# Patient Record
Sex: Female | Born: 1935
Health system: Southern US, Community
[De-identification: ages and names within clinical notes are randomized; demographics above are authoritative.]

## PROBLEM LIST (undated history)

## (undated) DIAGNOSIS — K219 Gastro-esophageal reflux disease without esophagitis: Secondary | ICD-10-CM

## (undated) DIAGNOSIS — M1990 Unspecified osteoarthritis, unspecified site: Secondary | ICD-10-CM

## (undated) DIAGNOSIS — R32 Unspecified urinary incontinence: Secondary | ICD-10-CM

## (undated) DIAGNOSIS — F32A Depression, unspecified: Secondary | ICD-10-CM

## (undated) DIAGNOSIS — F419 Anxiety disorder, unspecified: Secondary | ICD-10-CM

## (undated) HISTORY — DX: Anxiety disorder, unspecified: F41.9

## (undated) HISTORY — PX: ABDOMINAL HYSTERECTOMY: SHX81

## (undated) HISTORY — DX: Unspecified urinary incontinence: R32

## (undated) HISTORY — PX: EYE SURGERY: SHX253

## (undated) HISTORY — PX: JOINT REPLACEMENT: SHX530

## (undated) HISTORY — PX: BREAST EXCISIONAL BIOPSY: SUR124

## (undated) HISTORY — DX: Depression, unspecified: F32.A

---

## 1998-06-12 ENCOUNTER — Encounter: Payer: Self-pay | Admitting: Emergency Medicine

## 1998-06-12 ENCOUNTER — Emergency Department (HOSPITAL_COMMUNITY): Admission: EM | Admit: 1998-06-12 | Discharge: 1998-06-12 | Payer: Self-pay | Admitting: Emergency Medicine

## 2004-01-15 ENCOUNTER — Encounter: Admission: RE | Admit: 2004-01-15 | Discharge: 2004-01-15 | Payer: Self-pay | Admitting: Family Medicine

## 2004-07-16 ENCOUNTER — Encounter: Admission: RE | Admit: 2004-07-16 | Discharge: 2004-07-16 | Payer: Self-pay | Admitting: Internal Medicine

## 2005-07-29 ENCOUNTER — Encounter: Admission: RE | Admit: 2005-07-29 | Discharge: 2005-07-29 | Payer: Self-pay | Admitting: Internal Medicine

## 2006-08-03 ENCOUNTER — Encounter: Admission: RE | Admit: 2006-08-03 | Discharge: 2006-08-03 | Payer: Self-pay | Admitting: Internal Medicine

## 2007-03-23 ENCOUNTER — Encounter: Admission: RE | Admit: 2007-03-23 | Discharge: 2007-03-23 | Payer: Self-pay | Admitting: Internal Medicine

## 2007-09-30 ENCOUNTER — Encounter: Admission: RE | Admit: 2007-09-30 | Discharge: 2007-09-30 | Payer: Self-pay | Admitting: Internal Medicine

## 2008-10-09 ENCOUNTER — Encounter: Admission: RE | Admit: 2008-10-09 | Discharge: 2008-10-09 | Payer: Self-pay | Admitting: Obstetrics and Gynecology

## 2009-09-23 ENCOUNTER — Encounter: Admission: RE | Admit: 2009-09-23 | Discharge: 2009-10-21 | Payer: Self-pay | Admitting: Internal Medicine

## 2009-10-10 ENCOUNTER — Encounter: Admission: RE | Admit: 2009-10-10 | Discharge: 2009-10-10 | Payer: Self-pay | Admitting: Internal Medicine

## 2010-07-13 ENCOUNTER — Encounter: Payer: Self-pay | Admitting: Family Medicine

## 2010-09-29 ENCOUNTER — Other Ambulatory Visit: Payer: Self-pay | Admitting: Internal Medicine

## 2010-09-29 DIAGNOSIS — Z1231 Encounter for screening mammogram for malignant neoplasm of breast: Secondary | ICD-10-CM

## 2010-11-04 ENCOUNTER — Ambulatory Visit
Admission: RE | Admit: 2010-11-04 | Discharge: 2010-11-04 | Disposition: A | Discharge status: Home / Self Care | Payer: Medicare Other | Source: Ambulatory Visit | Attending: Internal Medicine | Admitting: Internal Medicine

## 2010-11-04 DIAGNOSIS — Z1231 Encounter for screening mammogram for malignant neoplasm of breast: Secondary | ICD-10-CM

## 2011-06-30 DIAGNOSIS — R229 Localized swelling, mass and lump, unspecified: Secondary | ICD-10-CM | POA: Diagnosis not present

## 2011-06-30 DIAGNOSIS — L989 Disorder of the skin and subcutaneous tissue, unspecified: Secondary | ICD-10-CM | POA: Diagnosis not present

## 2011-06-30 DIAGNOSIS — L820 Inflamed seborrheic keratosis: Secondary | ICD-10-CM | POA: Diagnosis not present

## 2011-07-06 DIAGNOSIS — E7800 Pure hypercholesterolemia, unspecified: Secondary | ICD-10-CM | POA: Diagnosis not present

## 2011-07-06 DIAGNOSIS — R5383 Other fatigue: Secondary | ICD-10-CM | POA: Diagnosis not present

## 2011-07-09 DIAGNOSIS — E7800 Pure hypercholesterolemia, unspecified: Secondary | ICD-10-CM | POA: Diagnosis not present

## 2011-07-15 DIAGNOSIS — IMO0002 Reserved for concepts with insufficient information to code with codable children: Secondary | ICD-10-CM | POA: Diagnosis not present

## 2011-07-15 DIAGNOSIS — M999 Biomechanical lesion, unspecified: Secondary | ICD-10-CM | POA: Diagnosis not present

## 2011-07-15 DIAGNOSIS — M546 Pain in thoracic spine: Secondary | ICD-10-CM | POA: Diagnosis not present

## 2011-08-26 DIAGNOSIS — IMO0002 Reserved for concepts with insufficient information to code with codable children: Secondary | ICD-10-CM | POA: Diagnosis not present

## 2011-08-26 DIAGNOSIS — M546 Pain in thoracic spine: Secondary | ICD-10-CM | POA: Diagnosis not present

## 2011-08-26 DIAGNOSIS — M999 Biomechanical lesion, unspecified: Secondary | ICD-10-CM | POA: Diagnosis not present

## 2011-09-29 ENCOUNTER — Other Ambulatory Visit: Payer: Self-pay | Admitting: Internal Medicine

## 2011-09-29 DIAGNOSIS — Z1231 Encounter for screening mammogram for malignant neoplasm of breast: Secondary | ICD-10-CM

## 2011-10-08 DIAGNOSIS — M546 Pain in thoracic spine: Secondary | ICD-10-CM | POA: Diagnosis not present

## 2011-10-08 DIAGNOSIS — M999 Biomechanical lesion, unspecified: Secondary | ICD-10-CM | POA: Diagnosis not present

## 2011-10-08 DIAGNOSIS — IMO0002 Reserved for concepts with insufficient information to code with codable children: Secondary | ICD-10-CM | POA: Diagnosis not present

## 2011-11-05 ENCOUNTER — Ambulatory Visit
Admission: RE | Admit: 2011-11-05 | Discharge: 2011-11-05 | Disposition: A | Discharge status: Home / Self Care | Payer: No Typology Code available for payment source | Source: Ambulatory Visit | Attending: Internal Medicine | Admitting: Internal Medicine

## 2011-11-05 DIAGNOSIS — Z1231 Encounter for screening mammogram for malignant neoplasm of breast: Secondary | ICD-10-CM

## 2011-11-12 DIAGNOSIS — H43819 Vitreous degeneration, unspecified eye: Secondary | ICD-10-CM | POA: Diagnosis not present

## 2011-11-17 ENCOUNTER — Encounter (INDEPENDENT_AMBULATORY_CARE_PROVIDER_SITE_OTHER): Payer: Medicare Other | Admitting: Ophthalmology

## 2011-11-17 DIAGNOSIS — H43819 Vitreous degeneration, unspecified eye: Secondary | ICD-10-CM

## 2011-11-17 DIAGNOSIS — H33309 Unspecified retinal break, unspecified eye: Secondary | ICD-10-CM

## 2011-12-01 DIAGNOSIS — Z01419 Encounter for gynecological examination (general) (routine) without abnormal findings: Secondary | ICD-10-CM | POA: Diagnosis not present

## 2011-12-02 ENCOUNTER — Ambulatory Visit (INDEPENDENT_AMBULATORY_CARE_PROVIDER_SITE_OTHER): Payer: Medicare Other | Admitting: Ophthalmology

## 2011-12-02 DIAGNOSIS — H33309 Unspecified retinal break, unspecified eye: Secondary | ICD-10-CM

## 2011-12-09 DIAGNOSIS — IMO0002 Reserved for concepts with insufficient information to code with codable children: Secondary | ICD-10-CM | POA: Diagnosis not present

## 2011-12-09 DIAGNOSIS — M999 Biomechanical lesion, unspecified: Secondary | ICD-10-CM | POA: Diagnosis not present

## 2011-12-09 DIAGNOSIS — M546 Pain in thoracic spine: Secondary | ICD-10-CM | POA: Diagnosis not present

## 2012-01-05 DIAGNOSIS — E559 Vitamin D deficiency, unspecified: Secondary | ICD-10-CM | POA: Diagnosis not present

## 2012-01-05 DIAGNOSIS — R5383 Other fatigue: Secondary | ICD-10-CM | POA: Diagnosis not present

## 2012-01-05 DIAGNOSIS — E7800 Pure hypercholesterolemia, unspecified: Secondary | ICD-10-CM | POA: Diagnosis not present

## 2012-01-05 DIAGNOSIS — R5381 Other malaise: Secondary | ICD-10-CM | POA: Diagnosis not present

## 2012-01-07 DIAGNOSIS — H43819 Vitreous degeneration, unspecified eye: Secondary | ICD-10-CM | POA: Diagnosis not present

## 2012-01-11 DIAGNOSIS — G4700 Insomnia, unspecified: Secondary | ICD-10-CM | POA: Diagnosis not present

## 2012-01-11 DIAGNOSIS — E7800 Pure hypercholesterolemia, unspecified: Secondary | ICD-10-CM | POA: Diagnosis not present

## 2012-01-11 DIAGNOSIS — E559 Vitamin D deficiency, unspecified: Secondary | ICD-10-CM | POA: Diagnosis not present

## 2012-02-09 DIAGNOSIS — IMO0002 Reserved for concepts with insufficient information to code with codable children: Secondary | ICD-10-CM | POA: Diagnosis not present

## 2012-02-09 DIAGNOSIS — M546 Pain in thoracic spine: Secondary | ICD-10-CM | POA: Diagnosis not present

## 2012-02-09 DIAGNOSIS — M999 Biomechanical lesion, unspecified: Secondary | ICD-10-CM | POA: Diagnosis not present

## 2012-02-10 DIAGNOSIS — Z13820 Encounter for screening for osteoporosis: Secondary | ICD-10-CM | POA: Diagnosis not present

## 2012-02-10 DIAGNOSIS — M810 Age-related osteoporosis without current pathological fracture: Secondary | ICD-10-CM | POA: Diagnosis not present

## 2012-04-04 ENCOUNTER — Ambulatory Visit (INDEPENDENT_AMBULATORY_CARE_PROVIDER_SITE_OTHER): Payer: Medicare Other | Admitting: Ophthalmology

## 2012-04-04 DIAGNOSIS — H43819 Vitreous degeneration, unspecified eye: Secondary | ICD-10-CM

## 2012-04-04 DIAGNOSIS — H33309 Unspecified retinal break, unspecified eye: Secondary | ICD-10-CM

## 2012-04-11 DIAGNOSIS — M999 Biomechanical lesion, unspecified: Secondary | ICD-10-CM | POA: Diagnosis not present

## 2012-04-11 DIAGNOSIS — M546 Pain in thoracic spine: Secondary | ICD-10-CM | POA: Diagnosis not present

## 2012-04-11 DIAGNOSIS — IMO0002 Reserved for concepts with insufficient information to code with codable children: Secondary | ICD-10-CM | POA: Diagnosis not present

## 2012-04-15 DIAGNOSIS — Z23 Encounter for immunization: Secondary | ICD-10-CM | POA: Diagnosis not present

## 2012-05-23 DIAGNOSIS — D131 Benign neoplasm of stomach: Secondary | ICD-10-CM | POA: Diagnosis not present

## 2012-05-23 DIAGNOSIS — K2270 Barrett's esophagus without dysplasia: Secondary | ICD-10-CM | POA: Diagnosis not present

## 2012-05-23 DIAGNOSIS — K449 Diaphragmatic hernia without obstruction or gangrene: Secondary | ICD-10-CM | POA: Diagnosis not present

## 2012-05-23 DIAGNOSIS — K319 Disease of stomach and duodenum, unspecified: Secondary | ICD-10-CM | POA: Diagnosis not present

## 2012-06-13 DIAGNOSIS — IMO0002 Reserved for concepts with insufficient information to code with codable children: Secondary | ICD-10-CM | POA: Diagnosis not present

## 2012-06-13 DIAGNOSIS — M546 Pain in thoracic spine: Secondary | ICD-10-CM | POA: Diagnosis not present

## 2012-06-13 DIAGNOSIS — M999 Biomechanical lesion, unspecified: Secondary | ICD-10-CM | POA: Diagnosis not present

## 2012-07-07 DIAGNOSIS — E7800 Pure hypercholesterolemia, unspecified: Secondary | ICD-10-CM | POA: Diagnosis not present

## 2012-07-13 DIAGNOSIS — G4700 Insomnia, unspecified: Secondary | ICD-10-CM | POA: Diagnosis not present

## 2012-07-13 DIAGNOSIS — E785 Hyperlipidemia, unspecified: Secondary | ICD-10-CM | POA: Diagnosis not present

## 2012-07-13 DIAGNOSIS — M76899 Other specified enthesopathies of unspecified lower limb, excluding foot: Secondary | ICD-10-CM | POA: Diagnosis not present

## 2012-08-15 DIAGNOSIS — M546 Pain in thoracic spine: Secondary | ICD-10-CM | POA: Diagnosis not present

## 2012-08-15 DIAGNOSIS — M999 Biomechanical lesion, unspecified: Secondary | ICD-10-CM | POA: Diagnosis not present

## 2012-08-15 DIAGNOSIS — IMO0002 Reserved for concepts with insufficient information to code with codable children: Secondary | ICD-10-CM | POA: Diagnosis not present

## 2012-08-29 DIAGNOSIS — M545 Low back pain: Secondary | ICD-10-CM | POA: Diagnosis not present

## 2012-08-29 DIAGNOSIS — IMO0002 Reserved for concepts with insufficient information to code with codable children: Secondary | ICD-10-CM | POA: Diagnosis not present

## 2012-08-29 DIAGNOSIS — M5137 Other intervertebral disc degeneration, lumbosacral region: Secondary | ICD-10-CM | POA: Diagnosis not present

## 2012-09-13 DIAGNOSIS — M545 Low back pain: Secondary | ICD-10-CM | POA: Diagnosis not present

## 2012-09-27 DIAGNOSIS — M5137 Other intervertebral disc degeneration, lumbosacral region: Secondary | ICD-10-CM | POA: Diagnosis not present

## 2012-09-27 DIAGNOSIS — IMO0002 Reserved for concepts with insufficient information to code with codable children: Secondary | ICD-10-CM | POA: Diagnosis not present

## 2012-09-27 DIAGNOSIS — M48061 Spinal stenosis, lumbar region without neurogenic claudication: Secondary | ICD-10-CM | POA: Diagnosis not present

## 2012-10-05 ENCOUNTER — Other Ambulatory Visit: Payer: Self-pay

## 2012-10-05 DIAGNOSIS — Z1231 Encounter for screening mammogram for malignant neoplasm of breast: Secondary | ICD-10-CM

## 2012-10-10 DIAGNOSIS — M546 Pain in thoracic spine: Secondary | ICD-10-CM | POA: Diagnosis not present

## 2012-10-10 DIAGNOSIS — IMO0002 Reserved for concepts with insufficient information to code with codable children: Secondary | ICD-10-CM | POA: Diagnosis not present

## 2012-10-10 DIAGNOSIS — M999 Biomechanical lesion, unspecified: Secondary | ICD-10-CM | POA: Diagnosis not present

## 2012-10-12 DIAGNOSIS — M48061 Spinal stenosis, lumbar region without neurogenic claudication: Secondary | ICD-10-CM | POA: Diagnosis not present

## 2012-10-12 DIAGNOSIS — M5137 Other intervertebral disc degeneration, lumbosacral region: Secondary | ICD-10-CM | POA: Diagnosis not present

## 2012-10-24 DIAGNOSIS — M48061 Spinal stenosis, lumbar region without neurogenic claudication: Secondary | ICD-10-CM | POA: Diagnosis not present

## 2012-10-24 DIAGNOSIS — M5137 Other intervertebral disc degeneration, lumbosacral region: Secondary | ICD-10-CM | POA: Diagnosis not present

## 2012-10-24 DIAGNOSIS — M545 Low back pain: Secondary | ICD-10-CM | POA: Diagnosis not present

## 2012-11-03 DIAGNOSIS — Z0000 Encounter for general adult medical examination without abnormal findings: Secondary | ICD-10-CM | POA: Diagnosis not present

## 2012-11-03 DIAGNOSIS — R42 Dizziness and giddiness: Secondary | ICD-10-CM | POA: Diagnosis not present

## 2012-11-07 ENCOUNTER — Ambulatory Visit
Admission: RE | Admit: 2012-11-07 | Discharge: 2012-11-07 | Disposition: A | Discharge status: Home / Self Care | Payer: Medicare Other | Source: Ambulatory Visit

## 2012-11-07 DIAGNOSIS — Z1231 Encounter for screening mammogram for malignant neoplasm of breast: Secondary | ICD-10-CM | POA: Diagnosis not present

## 2012-12-12 DIAGNOSIS — M546 Pain in thoracic spine: Secondary | ICD-10-CM | POA: Diagnosis not present

## 2012-12-12 DIAGNOSIS — IMO0002 Reserved for concepts with insufficient information to code with codable children: Secondary | ICD-10-CM | POA: Diagnosis not present

## 2012-12-12 DIAGNOSIS — Z124 Encounter for screening for malignant neoplasm of cervix: Secondary | ICD-10-CM | POA: Diagnosis not present

## 2012-12-12 DIAGNOSIS — M999 Biomechanical lesion, unspecified: Secondary | ICD-10-CM | POA: Diagnosis not present

## 2013-01-05 DIAGNOSIS — M76899 Other specified enthesopathies of unspecified lower limb, excluding foot: Secondary | ICD-10-CM | POA: Diagnosis not present

## 2013-01-05 DIAGNOSIS — E785 Hyperlipidemia, unspecified: Secondary | ICD-10-CM | POA: Diagnosis not present

## 2013-01-05 DIAGNOSIS — E559 Vitamin D deficiency, unspecified: Secondary | ICD-10-CM | POA: Diagnosis not present

## 2013-01-11 DIAGNOSIS — G4700 Insomnia, unspecified: Secondary | ICD-10-CM | POA: Diagnosis not present

## 2013-01-11 DIAGNOSIS — H43819 Vitreous degeneration, unspecified eye: Secondary | ICD-10-CM | POA: Diagnosis not present

## 2013-01-11 DIAGNOSIS — E785 Hyperlipidemia, unspecified: Secondary | ICD-10-CM | POA: Diagnosis not present

## 2013-01-11 DIAGNOSIS — E559 Vitamin D deficiency, unspecified: Secondary | ICD-10-CM | POA: Diagnosis not present

## 2013-01-11 DIAGNOSIS — M629 Disorder of muscle, unspecified: Secondary | ICD-10-CM | POA: Diagnosis not present

## 2013-02-13 DIAGNOSIS — M546 Pain in thoracic spine: Secondary | ICD-10-CM | POA: Diagnosis not present

## 2013-02-13 DIAGNOSIS — M999 Biomechanical lesion, unspecified: Secondary | ICD-10-CM | POA: Diagnosis not present

## 2013-02-13 DIAGNOSIS — IMO0002 Reserved for concepts with insufficient information to code with codable children: Secondary | ICD-10-CM | POA: Diagnosis not present

## 2013-03-06 DIAGNOSIS — M5137 Other intervertebral disc degeneration, lumbosacral region: Secondary | ICD-10-CM | POA: Diagnosis not present

## 2013-03-06 DIAGNOSIS — M25559 Pain in unspecified hip: Secondary | ICD-10-CM | POA: Diagnosis not present

## 2013-03-06 DIAGNOSIS — M999 Biomechanical lesion, unspecified: Secondary | ICD-10-CM | POA: Diagnosis not present

## 2013-03-06 DIAGNOSIS — M5126 Other intervertebral disc displacement, lumbar region: Secondary | ICD-10-CM | POA: Diagnosis not present

## 2013-03-06 DIAGNOSIS — IMO0002 Reserved for concepts with insufficient information to code with codable children: Secondary | ICD-10-CM | POA: Diagnosis not present

## 2013-03-07 DIAGNOSIS — M5137 Other intervertebral disc degeneration, lumbosacral region: Secondary | ICD-10-CM | POA: Diagnosis not present

## 2013-03-07 DIAGNOSIS — M999 Biomechanical lesion, unspecified: Secondary | ICD-10-CM | POA: Diagnosis not present

## 2013-03-07 DIAGNOSIS — M5126 Other intervertebral disc displacement, lumbar region: Secondary | ICD-10-CM | POA: Diagnosis not present

## 2013-03-07 DIAGNOSIS — IMO0002 Reserved for concepts with insufficient information to code with codable children: Secondary | ICD-10-CM | POA: Diagnosis not present

## 2013-03-07 DIAGNOSIS — M25559 Pain in unspecified hip: Secondary | ICD-10-CM | POA: Diagnosis not present

## 2013-03-08 DIAGNOSIS — M5137 Other intervertebral disc degeneration, lumbosacral region: Secondary | ICD-10-CM | POA: Diagnosis not present

## 2013-03-08 DIAGNOSIS — M25559 Pain in unspecified hip: Secondary | ICD-10-CM | POA: Diagnosis not present

## 2013-03-08 DIAGNOSIS — M999 Biomechanical lesion, unspecified: Secondary | ICD-10-CM | POA: Diagnosis not present

## 2013-03-08 DIAGNOSIS — M5126 Other intervertebral disc displacement, lumbar region: Secondary | ICD-10-CM | POA: Diagnosis not present

## 2013-03-08 DIAGNOSIS — IMO0002 Reserved for concepts with insufficient information to code with codable children: Secondary | ICD-10-CM | POA: Diagnosis not present

## 2013-03-10 DIAGNOSIS — IMO0002 Reserved for concepts with insufficient information to code with codable children: Secondary | ICD-10-CM | POA: Diagnosis not present

## 2013-03-10 DIAGNOSIS — M5137 Other intervertebral disc degeneration, lumbosacral region: Secondary | ICD-10-CM | POA: Diagnosis not present

## 2013-03-10 DIAGNOSIS — M5126 Other intervertebral disc displacement, lumbar region: Secondary | ICD-10-CM | POA: Diagnosis not present

## 2013-03-10 DIAGNOSIS — M999 Biomechanical lesion, unspecified: Secondary | ICD-10-CM | POA: Diagnosis not present

## 2013-03-10 DIAGNOSIS — M25559 Pain in unspecified hip: Secondary | ICD-10-CM | POA: Diagnosis not present

## 2013-03-10 DIAGNOSIS — Z23 Encounter for immunization: Secondary | ICD-10-CM | POA: Diagnosis not present

## 2013-03-13 DIAGNOSIS — M5126 Other intervertebral disc displacement, lumbar region: Secondary | ICD-10-CM | POA: Diagnosis not present

## 2013-03-13 DIAGNOSIS — M999 Biomechanical lesion, unspecified: Secondary | ICD-10-CM | POA: Diagnosis not present

## 2013-03-13 DIAGNOSIS — M5137 Other intervertebral disc degeneration, lumbosacral region: Secondary | ICD-10-CM | POA: Diagnosis not present

## 2013-03-13 DIAGNOSIS — M25559 Pain in unspecified hip: Secondary | ICD-10-CM | POA: Diagnosis not present

## 2013-03-13 DIAGNOSIS — IMO0002 Reserved for concepts with insufficient information to code with codable children: Secondary | ICD-10-CM | POA: Diagnosis not present

## 2013-03-14 DIAGNOSIS — M5137 Other intervertebral disc degeneration, lumbosacral region: Secondary | ICD-10-CM | POA: Diagnosis not present

## 2013-03-14 DIAGNOSIS — M25559 Pain in unspecified hip: Secondary | ICD-10-CM | POA: Diagnosis not present

## 2013-03-14 DIAGNOSIS — M999 Biomechanical lesion, unspecified: Secondary | ICD-10-CM | POA: Diagnosis not present

## 2013-03-14 DIAGNOSIS — IMO0002 Reserved for concepts with insufficient information to code with codable children: Secondary | ICD-10-CM | POA: Diagnosis not present

## 2013-03-14 DIAGNOSIS — M5126 Other intervertebral disc displacement, lumbar region: Secondary | ICD-10-CM | POA: Diagnosis not present

## 2013-03-16 DIAGNOSIS — M25559 Pain in unspecified hip: Secondary | ICD-10-CM | POA: Diagnosis not present

## 2013-03-16 DIAGNOSIS — M5137 Other intervertebral disc degeneration, lumbosacral region: Secondary | ICD-10-CM | POA: Diagnosis not present

## 2013-03-16 DIAGNOSIS — M5126 Other intervertebral disc displacement, lumbar region: Secondary | ICD-10-CM | POA: Diagnosis not present

## 2013-03-16 DIAGNOSIS — M999 Biomechanical lesion, unspecified: Secondary | ICD-10-CM | POA: Diagnosis not present

## 2013-03-16 DIAGNOSIS — IMO0002 Reserved for concepts with insufficient information to code with codable children: Secondary | ICD-10-CM | POA: Diagnosis not present

## 2013-03-20 DIAGNOSIS — M5126 Other intervertebral disc displacement, lumbar region: Secondary | ICD-10-CM | POA: Diagnosis not present

## 2013-03-20 DIAGNOSIS — M999 Biomechanical lesion, unspecified: Secondary | ICD-10-CM | POA: Diagnosis not present

## 2013-03-20 DIAGNOSIS — IMO0002 Reserved for concepts with insufficient information to code with codable children: Secondary | ICD-10-CM | POA: Diagnosis not present

## 2013-03-20 DIAGNOSIS — M25559 Pain in unspecified hip: Secondary | ICD-10-CM | POA: Diagnosis not present

## 2013-03-20 DIAGNOSIS — M5137 Other intervertebral disc degeneration, lumbosacral region: Secondary | ICD-10-CM | POA: Diagnosis not present

## 2013-03-21 DIAGNOSIS — M5137 Other intervertebral disc degeneration, lumbosacral region: Secondary | ICD-10-CM | POA: Diagnosis not present

## 2013-03-21 DIAGNOSIS — M25559 Pain in unspecified hip: Secondary | ICD-10-CM | POA: Diagnosis not present

## 2013-03-21 DIAGNOSIS — M5126 Other intervertebral disc displacement, lumbar region: Secondary | ICD-10-CM | POA: Diagnosis not present

## 2013-03-21 DIAGNOSIS — IMO0002 Reserved for concepts with insufficient information to code with codable children: Secondary | ICD-10-CM | POA: Diagnosis not present

## 2013-03-21 DIAGNOSIS — M999 Biomechanical lesion, unspecified: Secondary | ICD-10-CM | POA: Diagnosis not present

## 2013-03-23 DIAGNOSIS — IMO0002 Reserved for concepts with insufficient information to code with codable children: Secondary | ICD-10-CM | POA: Diagnosis not present

## 2013-03-23 DIAGNOSIS — M25559 Pain in unspecified hip: Secondary | ICD-10-CM | POA: Diagnosis not present

## 2013-03-23 DIAGNOSIS — M5126 Other intervertebral disc displacement, lumbar region: Secondary | ICD-10-CM | POA: Diagnosis not present

## 2013-03-23 DIAGNOSIS — M999 Biomechanical lesion, unspecified: Secondary | ICD-10-CM | POA: Diagnosis not present

## 2013-03-23 DIAGNOSIS — M5137 Other intervertebral disc degeneration, lumbosacral region: Secondary | ICD-10-CM | POA: Diagnosis not present

## 2013-03-27 DIAGNOSIS — M999 Biomechanical lesion, unspecified: Secondary | ICD-10-CM | POA: Diagnosis not present

## 2013-03-27 DIAGNOSIS — M5126 Other intervertebral disc displacement, lumbar region: Secondary | ICD-10-CM | POA: Diagnosis not present

## 2013-03-27 DIAGNOSIS — M5137 Other intervertebral disc degeneration, lumbosacral region: Secondary | ICD-10-CM | POA: Diagnosis not present

## 2013-03-27 DIAGNOSIS — IMO0002 Reserved for concepts with insufficient information to code with codable children: Secondary | ICD-10-CM | POA: Diagnosis not present

## 2013-03-27 DIAGNOSIS — M25559 Pain in unspecified hip: Secondary | ICD-10-CM | POA: Diagnosis not present

## 2013-03-28 DIAGNOSIS — M999 Biomechanical lesion, unspecified: Secondary | ICD-10-CM | POA: Diagnosis not present

## 2013-03-28 DIAGNOSIS — M5137 Other intervertebral disc degeneration, lumbosacral region: Secondary | ICD-10-CM | POA: Diagnosis not present

## 2013-03-28 DIAGNOSIS — M25559 Pain in unspecified hip: Secondary | ICD-10-CM | POA: Diagnosis not present

## 2013-03-28 DIAGNOSIS — M5126 Other intervertebral disc displacement, lumbar region: Secondary | ICD-10-CM | POA: Diagnosis not present

## 2013-03-28 DIAGNOSIS — IMO0002 Reserved for concepts with insufficient information to code with codable children: Secondary | ICD-10-CM | POA: Diagnosis not present

## 2013-03-30 DIAGNOSIS — IMO0002 Reserved for concepts with insufficient information to code with codable children: Secondary | ICD-10-CM | POA: Diagnosis not present

## 2013-03-30 DIAGNOSIS — M999 Biomechanical lesion, unspecified: Secondary | ICD-10-CM | POA: Diagnosis not present

## 2013-03-30 DIAGNOSIS — M5126 Other intervertebral disc displacement, lumbar region: Secondary | ICD-10-CM | POA: Diagnosis not present

## 2013-03-30 DIAGNOSIS — M5137 Other intervertebral disc degeneration, lumbosacral region: Secondary | ICD-10-CM | POA: Diagnosis not present

## 2013-03-30 DIAGNOSIS — M25559 Pain in unspecified hip: Secondary | ICD-10-CM | POA: Diagnosis not present

## 2013-04-03 DIAGNOSIS — IMO0002 Reserved for concepts with insufficient information to code with codable children: Secondary | ICD-10-CM | POA: Diagnosis not present

## 2013-04-03 DIAGNOSIS — M5126 Other intervertebral disc displacement, lumbar region: Secondary | ICD-10-CM | POA: Diagnosis not present

## 2013-04-03 DIAGNOSIS — M999 Biomechanical lesion, unspecified: Secondary | ICD-10-CM | POA: Diagnosis not present

## 2013-04-03 DIAGNOSIS — M5137 Other intervertebral disc degeneration, lumbosacral region: Secondary | ICD-10-CM | POA: Diagnosis not present

## 2013-04-03 DIAGNOSIS — M25559 Pain in unspecified hip: Secondary | ICD-10-CM | POA: Diagnosis not present

## 2013-04-05 ENCOUNTER — Ambulatory Visit (INDEPENDENT_AMBULATORY_CARE_PROVIDER_SITE_OTHER): Payer: Medicare Other | Admitting: Ophthalmology

## 2013-04-05 DIAGNOSIS — M25559 Pain in unspecified hip: Secondary | ICD-10-CM | POA: Diagnosis not present

## 2013-04-05 DIAGNOSIS — IMO0002 Reserved for concepts with insufficient information to code with codable children: Secondary | ICD-10-CM | POA: Diagnosis not present

## 2013-04-05 DIAGNOSIS — H33309 Unspecified retinal break, unspecified eye: Secondary | ICD-10-CM | POA: Diagnosis not present

## 2013-04-05 DIAGNOSIS — H43819 Vitreous degeneration, unspecified eye: Secondary | ICD-10-CM | POA: Diagnosis not present

## 2013-04-05 DIAGNOSIS — M5137 Other intervertebral disc degeneration, lumbosacral region: Secondary | ICD-10-CM | POA: Diagnosis not present

## 2013-04-05 DIAGNOSIS — M999 Biomechanical lesion, unspecified: Secondary | ICD-10-CM | POA: Diagnosis not present

## 2013-04-05 DIAGNOSIS — M5126 Other intervertebral disc displacement, lumbar region: Secondary | ICD-10-CM | POA: Diagnosis not present

## 2013-04-10 DIAGNOSIS — M5126 Other intervertebral disc displacement, lumbar region: Secondary | ICD-10-CM | POA: Diagnosis not present

## 2013-04-10 DIAGNOSIS — M5137 Other intervertebral disc degeneration, lumbosacral region: Secondary | ICD-10-CM | POA: Diagnosis not present

## 2013-04-10 DIAGNOSIS — IMO0002 Reserved for concepts with insufficient information to code with codable children: Secondary | ICD-10-CM | POA: Diagnosis not present

## 2013-04-10 DIAGNOSIS — M25559 Pain in unspecified hip: Secondary | ICD-10-CM | POA: Diagnosis not present

## 2013-04-10 DIAGNOSIS — M999 Biomechanical lesion, unspecified: Secondary | ICD-10-CM | POA: Diagnosis not present

## 2013-04-12 DIAGNOSIS — M25559 Pain in unspecified hip: Secondary | ICD-10-CM | POA: Diagnosis not present

## 2013-04-12 DIAGNOSIS — M5137 Other intervertebral disc degeneration, lumbosacral region: Secondary | ICD-10-CM | POA: Diagnosis not present

## 2013-04-12 DIAGNOSIS — IMO0002 Reserved for concepts with insufficient information to code with codable children: Secondary | ICD-10-CM | POA: Diagnosis not present

## 2013-04-12 DIAGNOSIS — M999 Biomechanical lesion, unspecified: Secondary | ICD-10-CM | POA: Diagnosis not present

## 2013-04-12 DIAGNOSIS — M5126 Other intervertebral disc displacement, lumbar region: Secondary | ICD-10-CM | POA: Diagnosis not present

## 2013-04-20 DIAGNOSIS — M5137 Other intervertebral disc degeneration, lumbosacral region: Secondary | ICD-10-CM | POA: Diagnosis not present

## 2013-04-20 DIAGNOSIS — M999 Biomechanical lesion, unspecified: Secondary | ICD-10-CM | POA: Diagnosis not present

## 2013-04-20 DIAGNOSIS — M5126 Other intervertebral disc displacement, lumbar region: Secondary | ICD-10-CM | POA: Diagnosis not present

## 2013-04-20 DIAGNOSIS — IMO0002 Reserved for concepts with insufficient information to code with codable children: Secondary | ICD-10-CM | POA: Diagnosis not present

## 2013-04-20 DIAGNOSIS — M25559 Pain in unspecified hip: Secondary | ICD-10-CM | POA: Diagnosis not present

## 2013-04-26 DIAGNOSIS — M999 Biomechanical lesion, unspecified: Secondary | ICD-10-CM | POA: Diagnosis not present

## 2013-04-26 DIAGNOSIS — M5126 Other intervertebral disc displacement, lumbar region: Secondary | ICD-10-CM | POA: Diagnosis not present

## 2013-04-26 DIAGNOSIS — M5137 Other intervertebral disc degeneration, lumbosacral region: Secondary | ICD-10-CM | POA: Diagnosis not present

## 2013-04-26 DIAGNOSIS — M25559 Pain in unspecified hip: Secondary | ICD-10-CM | POA: Diagnosis not present

## 2013-04-26 DIAGNOSIS — IMO0002 Reserved for concepts with insufficient information to code with codable children: Secondary | ICD-10-CM | POA: Diagnosis not present

## 2013-05-01 DIAGNOSIS — M25559 Pain in unspecified hip: Secondary | ICD-10-CM | POA: Diagnosis not present

## 2013-05-01 DIAGNOSIS — M5137 Other intervertebral disc degeneration, lumbosacral region: Secondary | ICD-10-CM | POA: Diagnosis not present

## 2013-05-01 DIAGNOSIS — M999 Biomechanical lesion, unspecified: Secondary | ICD-10-CM | POA: Diagnosis not present

## 2013-05-01 DIAGNOSIS — M5126 Other intervertebral disc displacement, lumbar region: Secondary | ICD-10-CM | POA: Diagnosis not present

## 2013-05-01 DIAGNOSIS — IMO0002 Reserved for concepts with insufficient information to code with codable children: Secondary | ICD-10-CM | POA: Diagnosis not present

## 2013-06-13 DIAGNOSIS — M545 Low back pain: Secondary | ICD-10-CM | POA: Diagnosis not present

## 2013-07-13 DIAGNOSIS — E559 Vitamin D deficiency, unspecified: Secondary | ICD-10-CM | POA: Diagnosis not present

## 2013-07-13 DIAGNOSIS — R5381 Other malaise: Secondary | ICD-10-CM | POA: Diagnosis not present

## 2013-07-13 DIAGNOSIS — R5383 Other fatigue: Secondary | ICD-10-CM | POA: Diagnosis not present

## 2013-07-13 DIAGNOSIS — E785 Hyperlipidemia, unspecified: Secondary | ICD-10-CM | POA: Diagnosis not present

## 2013-07-17 DIAGNOSIS — L821 Other seborrheic keratosis: Secondary | ICD-10-CM | POA: Diagnosis not present

## 2013-07-17 DIAGNOSIS — D239 Other benign neoplasm of skin, unspecified: Secondary | ICD-10-CM | POA: Diagnosis not present

## 2013-07-19 DIAGNOSIS — E7800 Pure hypercholesterolemia, unspecified: Secondary | ICD-10-CM | POA: Diagnosis not present

## 2013-07-19 DIAGNOSIS — G4700 Insomnia, unspecified: Secondary | ICD-10-CM | POA: Diagnosis not present

## 2013-08-04 DIAGNOSIS — M545 Low back pain, unspecified: Secondary | ICD-10-CM | POA: Diagnosis not present

## 2013-10-02 ENCOUNTER — Other Ambulatory Visit: Payer: Self-pay

## 2013-10-02 DIAGNOSIS — Z1231 Encounter for screening mammogram for malignant neoplasm of breast: Secondary | ICD-10-CM

## 2013-11-08 ENCOUNTER — Ambulatory Visit: Payer: Medicare Other

## 2013-11-14 ENCOUNTER — Ambulatory Visit
Admission: RE | Admit: 2013-11-14 | Discharge: 2013-11-14 | Disposition: A | Discharge status: Home / Self Care | Payer: Medicare Other | Source: Ambulatory Visit

## 2013-11-14 DIAGNOSIS — Z1231 Encounter for screening mammogram for malignant neoplasm of breast: Secondary | ICD-10-CM | POA: Diagnosis not present

## 2013-12-15 DIAGNOSIS — Z01419 Encounter for gynecological examination (general) (routine) without abnormal findings: Secondary | ICD-10-CM | POA: Diagnosis not present

## 2014-01-03 DIAGNOSIS — D2360 Other benign neoplasm of skin of unspecified upper limb, including shoulder: Secondary | ICD-10-CM | POA: Diagnosis not present

## 2014-01-03 DIAGNOSIS — C44711 Basal cell carcinoma of skin of unspecified lower limb, including hip: Secondary | ICD-10-CM | POA: Diagnosis not present

## 2014-01-03 DIAGNOSIS — Z85828 Personal history of other malignant neoplasm of skin: Secondary | ICD-10-CM | POA: Diagnosis not present

## 2014-01-03 DIAGNOSIS — L820 Inflamed seborrheic keratosis: Secondary | ICD-10-CM | POA: Diagnosis not present

## 2014-01-11 DIAGNOSIS — H26499 Other secondary cataract, unspecified eye: Secondary | ICD-10-CM | POA: Diagnosis not present

## 2014-01-12 DIAGNOSIS — E7800 Pure hypercholesterolemia, unspecified: Secondary | ICD-10-CM | POA: Diagnosis not present

## 2014-01-17 DIAGNOSIS — G4700 Insomnia, unspecified: Secondary | ICD-10-CM | POA: Diagnosis not present

## 2014-01-17 DIAGNOSIS — Z1331 Encounter for screening for depression: Secondary | ICD-10-CM | POA: Diagnosis not present

## 2014-01-17 DIAGNOSIS — E785 Hyperlipidemia, unspecified: Secondary | ICD-10-CM | POA: Diagnosis not present

## 2014-01-17 DIAGNOSIS — Z0000 Encounter for general adult medical examination without abnormal findings: Secondary | ICD-10-CM | POA: Diagnosis not present

## 2014-02-08 DIAGNOSIS — M546 Pain in thoracic spine: Secondary | ICD-10-CM | POA: Diagnosis not present

## 2014-02-08 DIAGNOSIS — IMO0002 Reserved for concepts with insufficient information to code with codable children: Secondary | ICD-10-CM | POA: Diagnosis not present

## 2014-02-08 DIAGNOSIS — M999 Biomechanical lesion, unspecified: Secondary | ICD-10-CM | POA: Diagnosis not present

## 2014-02-15 DIAGNOSIS — M546 Pain in thoracic spine: Secondary | ICD-10-CM | POA: Diagnosis not present

## 2014-02-15 DIAGNOSIS — IMO0002 Reserved for concepts with insufficient information to code with codable children: Secondary | ICD-10-CM | POA: Diagnosis not present

## 2014-02-15 DIAGNOSIS — M999 Biomechanical lesion, unspecified: Secondary | ICD-10-CM | POA: Diagnosis not present

## 2014-02-16 DIAGNOSIS — M479 Spondylosis, unspecified: Secondary | ICD-10-CM | POA: Diagnosis not present

## 2014-02-16 DIAGNOSIS — M810 Age-related osteoporosis without current pathological fracture: Secondary | ICD-10-CM | POA: Diagnosis not present

## 2014-02-16 DIAGNOSIS — M545 Low back pain, unspecified: Secondary | ICD-10-CM | POA: Diagnosis not present

## 2014-02-16 DIAGNOSIS — Z13820 Encounter for screening for osteoporosis: Secondary | ICD-10-CM | POA: Diagnosis not present

## 2014-02-19 DIAGNOSIS — M546 Pain in thoracic spine: Secondary | ICD-10-CM | POA: Diagnosis not present

## 2014-02-19 DIAGNOSIS — M999 Biomechanical lesion, unspecified: Secondary | ICD-10-CM | POA: Diagnosis not present

## 2014-02-19 DIAGNOSIS — IMO0002 Reserved for concepts with insufficient information to code with codable children: Secondary | ICD-10-CM | POA: Diagnosis not present

## 2014-02-22 DIAGNOSIS — M546 Pain in thoracic spine: Secondary | ICD-10-CM | POA: Diagnosis not present

## 2014-02-22 DIAGNOSIS — IMO0002 Reserved for concepts with insufficient information to code with codable children: Secondary | ICD-10-CM | POA: Diagnosis not present

## 2014-02-22 DIAGNOSIS — M999 Biomechanical lesion, unspecified: Secondary | ICD-10-CM | POA: Diagnosis not present

## 2014-03-01 DIAGNOSIS — M999 Biomechanical lesion, unspecified: Secondary | ICD-10-CM | POA: Diagnosis not present

## 2014-03-01 DIAGNOSIS — IMO0002 Reserved for concepts with insufficient information to code with codable children: Secondary | ICD-10-CM | POA: Diagnosis not present

## 2014-03-01 DIAGNOSIS — M546 Pain in thoracic spine: Secondary | ICD-10-CM | POA: Diagnosis not present

## 2014-03-06 DIAGNOSIS — M546 Pain in thoracic spine: Secondary | ICD-10-CM | POA: Diagnosis not present

## 2014-03-06 DIAGNOSIS — IMO0002 Reserved for concepts with insufficient information to code with codable children: Secondary | ICD-10-CM | POA: Diagnosis not present

## 2014-03-06 DIAGNOSIS — M999 Biomechanical lesion, unspecified: Secondary | ICD-10-CM | POA: Diagnosis not present

## 2014-03-09 DIAGNOSIS — M546 Pain in thoracic spine: Secondary | ICD-10-CM | POA: Diagnosis not present

## 2014-03-09 DIAGNOSIS — IMO0002 Reserved for concepts with insufficient information to code with codable children: Secondary | ICD-10-CM | POA: Diagnosis not present

## 2014-03-09 DIAGNOSIS — M999 Biomechanical lesion, unspecified: Secondary | ICD-10-CM | POA: Diagnosis not present

## 2014-03-12 DIAGNOSIS — M999 Biomechanical lesion, unspecified: Secondary | ICD-10-CM | POA: Diagnosis not present

## 2014-03-12 DIAGNOSIS — IMO0002 Reserved for concepts with insufficient information to code with codable children: Secondary | ICD-10-CM | POA: Diagnosis not present

## 2014-03-12 DIAGNOSIS — M546 Pain in thoracic spine: Secondary | ICD-10-CM | POA: Diagnosis not present

## 2014-03-15 DIAGNOSIS — IMO0002 Reserved for concepts with insufficient information to code with codable children: Secondary | ICD-10-CM | POA: Diagnosis not present

## 2014-03-15 DIAGNOSIS — M546 Pain in thoracic spine: Secondary | ICD-10-CM | POA: Diagnosis not present

## 2014-03-15 DIAGNOSIS — M999 Biomechanical lesion, unspecified: Secondary | ICD-10-CM | POA: Diagnosis not present

## 2014-03-16 ENCOUNTER — Ambulatory Visit (HOSPITAL_COMMUNITY)
Admission: RE | Admit: 2014-03-16 | Discharge: 2014-03-16 | Disposition: A | Discharge status: Home / Self Care | Payer: Medicare Other | Source: Ambulatory Visit | Attending: Chiropractic Medicine | Admitting: Chiropractic Medicine

## 2014-03-16 ENCOUNTER — Other Ambulatory Visit (HOSPITAL_COMMUNITY): Payer: Self-pay | Admitting: Chiropractic Medicine

## 2014-03-16 DIAGNOSIS — M1610 Unilateral primary osteoarthritis, unspecified hip: Secondary | ICD-10-CM | POA: Diagnosis not present

## 2014-03-16 DIAGNOSIS — M25559 Pain in unspecified hip: Secondary | ICD-10-CM | POA: Insufficient documentation

## 2014-03-16 DIAGNOSIS — M25551 Pain in right hip: Secondary | ICD-10-CM

## 2014-03-16 DIAGNOSIS — M25552 Pain in left hip: Principal | ICD-10-CM

## 2014-03-16 DIAGNOSIS — M169 Osteoarthritis of hip, unspecified: Secondary | ICD-10-CM | POA: Diagnosis not present

## 2014-03-20 DIAGNOSIS — IMO0002 Reserved for concepts with insufficient information to code with codable children: Secondary | ICD-10-CM | POA: Diagnosis not present

## 2014-03-20 DIAGNOSIS — M999 Biomechanical lesion, unspecified: Secondary | ICD-10-CM | POA: Diagnosis not present

## 2014-03-20 DIAGNOSIS — M546 Pain in thoracic spine: Secondary | ICD-10-CM | POA: Diagnosis not present

## 2014-03-22 DIAGNOSIS — M9903 Segmental and somatic dysfunction of lumbar region: Secondary | ICD-10-CM | POA: Diagnosis not present

## 2014-03-22 DIAGNOSIS — M545 Low back pain: Secondary | ICD-10-CM | POA: Diagnosis not present

## 2014-03-22 DIAGNOSIS — M5416 Radiculopathy, lumbar region: Secondary | ICD-10-CM | POA: Diagnosis not present

## 2014-03-27 DIAGNOSIS — M5416 Radiculopathy, lumbar region: Secondary | ICD-10-CM | POA: Diagnosis not present

## 2014-03-27 DIAGNOSIS — M545 Low back pain: Secondary | ICD-10-CM | POA: Diagnosis not present

## 2014-03-27 DIAGNOSIS — M9903 Segmental and somatic dysfunction of lumbar region: Secondary | ICD-10-CM | POA: Diagnosis not present

## 2014-03-30 DIAGNOSIS — M545 Low back pain: Secondary | ICD-10-CM | POA: Diagnosis not present

## 2014-03-30 DIAGNOSIS — M9903 Segmental and somatic dysfunction of lumbar region: Secondary | ICD-10-CM | POA: Diagnosis not present

## 2014-03-30 DIAGNOSIS — M5416 Radiculopathy, lumbar region: Secondary | ICD-10-CM | POA: Diagnosis not present

## 2014-04-04 DIAGNOSIS — M1711 Unilateral primary osteoarthritis, right knee: Secondary | ICD-10-CM | POA: Diagnosis not present

## 2014-04-04 DIAGNOSIS — M25561 Pain in right knee: Secondary | ICD-10-CM | POA: Diagnosis not present

## 2014-04-04 DIAGNOSIS — M25551 Pain in right hip: Secondary | ICD-10-CM | POA: Diagnosis not present

## 2014-04-04 DIAGNOSIS — Z23 Encounter for immunization: Secondary | ICD-10-CM | POA: Diagnosis not present

## 2014-04-04 DIAGNOSIS — M25571 Pain in right ankle and joints of right foot: Secondary | ICD-10-CM | POA: Diagnosis not present

## 2014-04-04 DIAGNOSIS — M1611 Unilateral primary osteoarthritis, right hip: Secondary | ICD-10-CM | POA: Diagnosis not present

## 2014-04-09 DIAGNOSIS — M9903 Segmental and somatic dysfunction of lumbar region: Secondary | ICD-10-CM | POA: Diagnosis not present

## 2014-04-09 DIAGNOSIS — M545 Low back pain: Secondary | ICD-10-CM | POA: Diagnosis not present

## 2014-04-09 DIAGNOSIS — M5416 Radiculopathy, lumbar region: Secondary | ICD-10-CM | POA: Diagnosis not present

## 2014-04-25 DIAGNOSIS — M25551 Pain in right hip: Secondary | ICD-10-CM | POA: Diagnosis not present

## 2014-04-30 ENCOUNTER — Ambulatory Visit (INDEPENDENT_AMBULATORY_CARE_PROVIDER_SITE_OTHER): Payer: Medicare Other | Admitting: Ophthalmology

## 2014-04-30 DIAGNOSIS — H43813 Vitreous degeneration, bilateral: Secondary | ICD-10-CM | POA: Diagnosis not present

## 2014-04-30 DIAGNOSIS — H33302 Unspecified retinal break, left eye: Secondary | ICD-10-CM

## 2014-05-15 DIAGNOSIS — M5410 Radiculopathy, site unspecified: Secondary | ICD-10-CM | POA: Diagnosis not present

## 2014-05-15 DIAGNOSIS — M25551 Pain in right hip: Secondary | ICD-10-CM | POA: Diagnosis not present

## 2014-05-15 DIAGNOSIS — M1711 Unilateral primary osteoarthritis, right knee: Secondary | ICD-10-CM | POA: Diagnosis not present

## 2014-05-15 DIAGNOSIS — M5136 Other intervertebral disc degeneration, lumbar region: Secondary | ICD-10-CM | POA: Diagnosis not present

## 2014-05-23 DIAGNOSIS — M25551 Pain in right hip: Secondary | ICD-10-CM | POA: Diagnosis not present

## 2014-06-19 ENCOUNTER — Encounter (HOSPITAL_COMMUNITY): Payer: Self-pay

## 2014-06-19 ENCOUNTER — Encounter (HOSPITAL_COMMUNITY)
Admission: RE | Admit: 2014-06-19 | Discharge: 2014-06-19 | Disposition: A | Discharge status: Home / Self Care | Payer: Medicare Other | Source: Ambulatory Visit | Attending: Orthopedic Surgery | Admitting: Orthopedic Surgery

## 2014-06-19 DIAGNOSIS — Z7982 Long term (current) use of aspirin: Secondary | ICD-10-CM | POA: Diagnosis not present

## 2014-06-19 DIAGNOSIS — E785 Hyperlipidemia, unspecified: Secondary | ICD-10-CM | POA: Diagnosis not present

## 2014-06-19 DIAGNOSIS — M1611 Unilateral primary osteoarthritis, right hip: Secondary | ICD-10-CM | POA: Diagnosis not present

## 2014-06-19 DIAGNOSIS — Z01812 Encounter for preprocedural laboratory examination: Secondary | ICD-10-CM | POA: Insufficient documentation

## 2014-06-19 DIAGNOSIS — Z01818 Encounter for other preprocedural examination: Secondary | ICD-10-CM | POA: Diagnosis not present

## 2014-06-19 HISTORY — DX: Gastro-esophageal reflux disease without esophagitis: K21.9

## 2014-06-19 HISTORY — DX: Unspecified osteoarthritis, unspecified site: M19.90

## 2014-06-19 LAB — PROTIME-INR
INR: 1.02 (ref 0.00–1.49)
PROTHROMBIN TIME: 13.6 s (ref 11.6–15.2)

## 2014-06-19 LAB — ABO/RH: ABO/RH(D): A POS

## 2014-06-19 LAB — SURGICAL PCR SCREEN
MRSA, PCR: NEGATIVE
Staphylococcus aureus: POSITIVE — AB

## 2014-06-19 LAB — APTT: aPTT: 28 seconds (ref 24–37)

## 2014-06-19 NOTE — Progress Notes (Signed)
CBC/DIFF/CMP and Urinalysis done at office of Dr Minna Antis.  Labs placed on chart.

## 2014-06-19 NOTE — Patient Instructions (Signed)
Ariana Ray  06/19/2014   Your procedure is scheduled on: 06/26/2014    Report to [facility]  Entrance and follow signs to               [place] at     1245 AM.  Call this number if you have problems the morning of surgery [phone]   Remember:  Do not eat food after midntie.  May have clear liquids until 0900am then nothing by mouth.      Take these medicines the morning of surgery with A SIP OF WATER: none                                You may not have any metal on your body including hair pins and              piercings  Do not wear jewelry, make-up, lotions, powders or perfumes.             Do not wear nail polish.  Do not shave  48 hours prior to surgery.     Do not bring valuables to the hospital. [place].  Contacts, dentures or bridgework may not be worn into surgery.  Leave suitcase in the car. After surgery it may be brought to your room.         Special Instructions: coughing and deep breathing exercises, leg exercises               Please read over the following fact sheets you were given: _____________________________________________________________________                CLEAR LIQUID DIET   Foods Allowed                                                                     Foods Excluded  Coffee and tea, regular and decaf                             liquids that you cannot  Plain Jell-O in any flavor                                             see through such as: Fruit ices (not with fruit pulp)                                     milk, soups, orange juice  Iced Popsicles                                    All solid food Carbonated beverages, regular and diet  Cranberry, grape and apple juices Sports drinks like Gatorade Lightly seasoned clear broth or consume(fat free) Sugar, honey syrup  Sample Menu Breakfast                                 Lunch                                     Supper Cranberry juice                    Beef broth                            Chicken broth Jell-O                                     Grape juice                           Apple juice Coffee or tea                        Jell-O                                      Popsicle                                                Coffee or tea                        Coffee or tea  _____________________________________________________________________  [facility] - Preparing for Surgery Before surgery, you can play an important role.  Because skin is not sterile, your skin needs to be as free of germs as possible.  You can reduce the number of germs on your skin by washing with CHG (chlorahexidine gluconate) soap before surgery.  CHG is an antiseptic cleaner which kills germs and bonds with the skin to continue killing germs even after washing. Please DO NOT use if you have an allergy to CHG or antibacterial soaps.  If your skin becomes reddened/irritated stop using the CHG and inform your nurse when you arrive at Short Stay. Do not shave (including legs and underarms) for at least 48 hours prior to the first CHG shower.  You may shave your face/neck. Please follow these instructions carefully:  1.  Shower with CHG Soap the night before surgery and the  morning of Surgery.  2.  If you choose to wash your hair, wash your hair first as usual with your  normal  shampoo.  3.  After you shampoo, rinse your hair and body thoroughly to remove the  shampoo.                           4.  Use CHG as you would any other liquid soap.  You can apply chg directly  to the skin and wash  Gently with a scrungie or clean washcloth.  5.  Apply the CHG Soap to your body ONLY FROM THE NECK DOWN.   Do not use on face/ open                           Wound or open sores. Avoid contact with eyes, ears mouth and genitals (private parts).                        Wash face,  Genitals (private parts) with your normal soap.             6.  Wash thoroughly, paying special attention to the area where your surgery  will be performed.  7.  Thoroughly rinse your body with warm water from the neck down.  8.  DO NOT shower/wash with your normal soap after using and rinsing off  the CHG Soap.                9.  Pat yourself dry with a clean towel.            10.  Wear clean pajamas.            11.  Place clean sheets on your bed the night of your first shower and do not  sleep with pets. Day of Surgery : Do not apply any lotions/deodorants the morning of surgery.  Please wear clean clothes to the hospital/surgery center.  FAILURE TO FOLLOW THESE INSTRUCTIONS MAY RESULT IN THE CANCELLATION OF YOUR SURGERY PATIENT SIGNATURE_________________________________  NURSE SIGNATURE__________________________________  ________________________________________________________________________  WHAT IS A BLOOD TRANSFUSION? Blood Transfusion Information  A transfusion is the replacement of blood or some of its parts. Blood is made up of multiple cells which provide different functions.  Red blood cells carry oxygen and are used for blood loss replacement.  White blood cells fight against infection.  Platelets control bleeding.  Plasma helps clot blood.  Other blood products are available for specialized needs, such as hemophilia or other clotting disorders. BEFORE THE TRANSFUSION  Who gives blood for transfusions?   Healthy volunteers who are fully evaluated to make sure their blood is safe. This is blood bank blood. Transfusion therapy is the safest it has ever been in the practice of medicine. Before blood is taken from a donor, a complete history is taken to make sure that person has no history of diseases nor engages in risky social behavior (examples are intravenous drug use or sexual activity with multiple partners). The donor's travel history is screened to  minimize risk of transmitting infections, such as malaria. The donated blood is tested for signs of infectious diseases, such as HIV and hepatitis. The blood is then tested to be sure it is compatible with you in order to minimize the chance of a transfusion reaction. If you or a relative donates blood, this is often done in anticipation of surgery and is not appropriate for emergency situations. It takes many days to process the donated blood. RISKS AND COMPLICATIONS Although transfusion therapy is very safe and saves many lives, the main dangers of transfusion include:  1. Getting an infectious disease. 2. Developing a transfusion reaction. This is an allergic reaction to something in the blood you were given. Every precaution is taken to prevent this. The decision to have a blood transfusion has been considered carefully by your caregiver before blood is given. Blood is not given unless the benefits  outweigh the risks. AFTER THE TRANSFUSION  Right after receiving a blood transfusion, you will usually feel much better and more energetic. This is especially true if your red blood cells have gotten low (anemic). The transfusion raises the level of the red blood cells which carry oxygen, and this usually causes an energy increase.  The nurse administering the transfusion will monitor you carefully for complications. HOME CARE INSTRUCTIONS  No special instructions are needed after a transfusion. You may find your energy is better. Speak with your caregiver about any limitations on activity for underlying diseases you may have. SEEK MEDICAL CARE IF:   Your condition is not improving after your transfusion.  You develop redness or irritation at the intravenous (IV) site. SEEK IMMEDIATE MEDICAL CARE IF:  Any of the following symptoms occur over the next 12 hours:  Shaking chills.  You have a temperature by mouth above 102 F (38.9 C), not controlled by medicine.  Chest, back, or muscle  pain.  People around you feel you are not acting correctly or are confused.  Shortness of breath or difficulty breathing.  Dizziness and fainting.  You get a rash or develop hives.  You have a decrease in urine output.  Your urine turns a dark color or changes to pink, red, or brown. Any of the following symptoms occur over the next 10 days:  You have a temperature by mouth above 102 F (38.9 C), not controlled by medicine.  Shortness of breath.  Weakness after normal activity.  The white part of the eye turns yellow (jaundice).  You have a decrease in the amount of urine or are urinating less often.  Your urine turns a dark color or changes to pink, red, or brown. Document Released: 06/05/2000 Document Revised: 08/31/2011 Document Reviewed: 01/23/2008 Exit[employer] Patient Information 2014 [place].  _______________________________________________________________________  Incentive Spirometer  An incentive spirometer is a tool that can help keep your lungs clear and active. This tool measures how well you are filling your lungs with each breath. Taking long deep breaths may help reverse or decrease the chance of developing breathing (pulmonary) problems (especially infection) following:  A long period of time when you are unable to move or be active. BEFORE THE PROCEDURE   If the spirometer includes an indicator to show your best effort, your nurse or respiratory therapist will set it to a desired goal.  If possible, sit up straight or lean slightly forward. Try not to slouch.  Hold the incentive spirometer in an upright position. INSTRUCTIONS FOR USE  3. Sit on the edge of your bed if possible, or sit up as far as you can in bed or on a chair. 4. Hold the incentive spirometer in an upright position. 5. Breathe out normally. 6. Place the mouthpiece in your mouth and seal your lips tightly around it. 7. Breathe in slowly and as deeply as possible, raising the piston  or the ball toward the top of the column. 8. Hold your breath for 3-5 seconds or for as long as possible. Allow the piston or ball to fall to the bottom of the column. 9. Remove the mouthpiece from your mouth and breathe out normally. 10. Rest for a few seconds and repeat Steps 1 through 7 at least 10 times every 1-2 hours when you are awake. Take your time and take a few normal breaths between deep breaths. 11. The spirometer may include an indicator to show your best effort. Use the indicator as a goal to  work toward during each repetition. 12. After each set of 10 deep breaths, practice coughing to be sure your lungs are clear. If you have an incision (the cut made at the time of surgery), support your incision when coughing by placing a pillow or rolled up towels firmly against it. Once you are able to get out of bed, walk around indoors and cough well. You may stop using the incentive spirometer when instructed by your caregiver.  RISKS AND COMPLICATIONS  Take your time so you do not get dizzy or light-headed.  If you are in pain, you may need to take or ask for pain medication before doing incentive spirometry. It is harder to take a deep breath if you are having pain. AFTER USE  Rest and breathe slowly and easily.  It can be helpful to keep track of a log of your progress. Your caregiver can provide you with a simple table to help with this. If you are using the spirometer at home, follow these instructions: [place] IF:   You are having difficultly using the spirometer.  You have trouble using the spirometer as often as instructed.  Your pain medication is not giving enough relief while using the spirometer.  You develop fever of 100.5 F (38.1 C) or higher. SEEK IMMEDIATE MEDICAL CARE IF:   You cough up bloody sputum that had not been present before.  You develop fever of 102 F (38.9 C) or greater.  You develop worsening pain at or near the incision site. MAKE  SURE YOU:   Understand these instructions.  Will watch your condition.  Will get help right away if you are not doing well or get worse. Document Released: 10/19/2006 Document Revised: 08/31/2011 Document Reviewed: 12/20/2006 [facility] Patient Information 2014 [place], [place].   ________________________________________________________________________

## 2014-06-19 NOTE — Progress Notes (Signed)
LOV from DR pang- 06/19/2014 on chart along with clearance.

## 2014-06-24 NOTE — H&P (Signed)
TOTAL HIP ADMISSION H&P  Patient is admitted for right total hip arthroplasty, anterior approach.  Subjective:  Chief Complaint:    Right hip primary OA / pain  HPI: Ariana Ray, 81 y.o. female, has a history of pain and functional disability in the right hip(s) due to arthritis and patient has failed non-surgical conservative treatments for greater than 12 weeks to include NSAID's and/or analgesics, corticosteriod injections, use of assistive devices and activity modification.  Onset of symptoms was gradual starting ~1 years ago with gradually worsening course since that time.The patient noted no past surgery on the right hip(s).  Patient currently rates pain in the right hip at 8 out of 10 with activity. Patient has night pain, worsening of pain with activity and weight bearing, trendelenberg gait, pain that interfers with activities of daily living and pain with passive range of motion. Patient has evidence of periarticular osteophytes and joint space narrowing by imaging studies. This condition presents safety issues increasing the risk of falls.  There is no current active infection.  Risks, benefits and expectations were discussed with the patient.  Risks including but not limited to the risk of anesthesia, blood clots, nerve damage, blood vessel damage, failure of the prosthesis, infection and up to and including death.  Patient understand the risks, benefits and expectations and wishes to proceed with surgery.   PCP: Tommy Medal, MD  D/C Plans:      Home with HHPT  Post-op Meds:       No Rx given  Tranexamic Acid:      To be given - IV   Decadron:      Is to be given  FYI:     ASA post-op  Norco post-op     Past Medical History  Diagnosis Date  . GERD (gastroesophageal reflux disease)     hx of   . Arthritis     Past Surgical History  Procedure Laterality Date  . Abdominal hysterectomy      No prescriptions prior to admission   No Known Allergies   History   Substance Use Topics  . Smoking status: Never Smoker   . Smokeless tobacco: Never Used  . Alcohol Use: Yes     Comment: 1/4 cup of red wine daily     No family history on file.   Review of Systems  Constitutional: Negative.   HENT: Negative.   Eyes: Negative.   Respiratory: Negative.   Cardiovascular: Negative.   Gastrointestinal: Positive for heartburn.  Genitourinary: Negative.   Musculoskeletal: Positive for joint pain.  Skin: Negative.   Neurological: Negative.   Endo/Heme/Allergies: Negative.   Psychiatric/Behavioral: The patient has insomnia.     Objective:  Physical Exam  Constitutional: She is oriented to person, place, and time. She appears well-developed and well-nourished.  HENT:  Head: Normocephalic and atraumatic.  Eyes: Pupils are equal, round, and reactive to light.  Neck: Neck supple. No JVD present. No tracheal deviation present. No thyromegaly present.  Cardiovascular: Normal rate, regular rhythm, normal heart sounds and intact distal pulses.   Respiratory: Effort normal and breath sounds normal. No stridor. No respiratory distress. She has no wheezes.  GI: Soft. There is no tenderness. There is no guarding.  Musculoskeletal:       Right hip: She exhibits decreased range of motion, decreased strength, tenderness and bony tenderness. She exhibits no swelling, no deformity and no laceration.  Lymphadenopathy:    She has no cervical adenopathy.  Neurological: She is alert and oriented  to person, place, and time.  Skin: Skin is warm and dry.  Psychiatric: She has a normal mood and affect.        Imaging Review Plain radiographs demonstrate severe degenerative joint disease of the right hip(s). The bone quality appears to be good for age and reported activity level.  Assessment/Plan:  End stage arthritis, right hip(s)  The patient history, physical examination, clinical judgement of the provider and imaging studies are consistent with end stage  degenerative joint disease of the right hip(s) and total hip arthroplasty is deemed medically necessary. The treatment options including medical management, injection therapy, arthroscopy and arthroplasty were discussed at length. The risks and benefits of total hip arthroplasty were presented and reviewed. The risks due to aseptic loosening, infection, stiffness, dislocation/subluxation,  thromboembolic complications and other imponderables were discussed.  The patient acknowledged the explanation, agreed to proceed with the plan and consent was signed. Patient is being admitted for inpatient treatment for surgery, pain control, PT, OT, prophylactic antibiotics, VTE prophylaxis, progressive ambulation and ADL's and discharge planning.The patient is planning to be discharged home with home health services.     [place] P[place] Zaydyn Havey   PA-C  06/24/2014, 7:24 PM

## 2014-06-26 ENCOUNTER — Inpatient Hospital Stay (HOSPITAL_COMMUNITY): Payer: Medicare Other

## 2014-06-26 ENCOUNTER — Inpatient Hospital Stay (HOSPITAL_COMMUNITY): Payer: Medicare Other | Admitting: Anesthesiology

## 2014-06-26 ENCOUNTER — Encounter (HOSPITAL_COMMUNITY): Payer: Self-pay | Admitting: *Deleted

## 2014-06-26 ENCOUNTER — Encounter (HOSPITAL_COMMUNITY)
Admission: RE | Disposition: A | Discharge status: Home / Self Care | Payer: Self-pay | Source: Ambulatory Visit | Attending: Orthopedic Surgery

## 2014-06-26 ENCOUNTER — Inpatient Hospital Stay (HOSPITAL_COMMUNITY)
Admission: RE | Admit: 2014-06-26 | Discharge: 2014-06-28 | DRG: 470 | Disposition: A | Discharge status: Home / Self Care | Payer: Medicare Other | Source: Ambulatory Visit | Attending: Orthopedic Surgery | Admitting: Orthopedic Surgery

## 2014-06-26 DIAGNOSIS — Z96641 Presence of right artificial hip joint: Secondary | ICD-10-CM | POA: Diagnosis not present

## 2014-06-26 DIAGNOSIS — Z471 Aftercare following joint replacement surgery: Secondary | ICD-10-CM | POA: Diagnosis not present

## 2014-06-26 DIAGNOSIS — Z96649 Presence of unspecified artificial hip joint: Secondary | ICD-10-CM

## 2014-06-26 DIAGNOSIS — F1010 Alcohol abuse, uncomplicated: Secondary | ICD-10-CM | POA: Diagnosis present

## 2014-06-26 DIAGNOSIS — K219 Gastro-esophageal reflux disease without esophagitis: Secondary | ICD-10-CM | POA: Diagnosis present

## 2014-06-26 DIAGNOSIS — M1611 Unilateral primary osteoarthritis, right hip: Secondary | ICD-10-CM | POA: Diagnosis not present

## 2014-06-26 DIAGNOSIS — Z90710 Acquired absence of both cervix and uterus: Secondary | ICD-10-CM

## 2014-06-26 DIAGNOSIS — M169 Osteoarthritis of hip, unspecified: Secondary | ICD-10-CM | POA: Diagnosis not present

## 2014-06-26 HISTORY — PX: TOTAL HIP ARTHROPLASTY: SHX124

## 2014-06-26 LAB — TYPE AND SCREEN
ABO/RH(D): A POS
ANTIBODY SCREEN: NEGATIVE

## 2014-06-26 LAB — GLUCOSE, CAPILLARY: GLUCOSE-CAPILLARY: 173 mg/dL — AB (ref 70–99)

## 2014-06-26 SURGERY — ARTHROPLASTY, HIP, TOTAL, ANTERIOR APPROACH
Anesthesia: Spinal | Site: Hip | Laterality: Right

## 2014-06-26 MED ORDER — BISACODYL 10 MG RE SUPP: 10.0000 mg | Freq: Every day | RECTAL | Status: DC | PRN

## 2014-06-26 MED ORDER — HYDROMORPHONE HCL 1 MG/ML IJ SOLN: 0.5000 mg | INTRAMUSCULAR | Status: DC | PRN

## 2014-06-26 MED ORDER — TRANEXAMIC ACID 100 MG/ML IV SOLN
1000.0000 mg | Freq: Once | INTRAVENOUS | Status: AC
  Administered 2014-06-26: 1000 mg via INTRAVENOUS
  Filled 2014-06-26: qty 10

## 2014-06-26 MED ORDER — HYDROCODONE-ACETAMINOPHEN 7.5-325 MG PO TABS
1.0000 | ORAL_TABLET | ORAL | Status: DC
  Administered 2014-06-26 – 2014-06-27 (×5): 1 via ORAL
  Administered 2014-06-27: 2 via ORAL
  Administered 2014-06-28 (×2): 1 via ORAL
  Filled 2014-06-26 (×2): qty 2
  Filled 2014-06-26 (×6): qty 1

## 2014-06-26 MED ORDER — FENTANYL CITRATE 0.05 MG/ML IJ SOLN
INTRAMUSCULAR | Status: AC
  Filled 2014-06-26: qty 2

## 2014-06-26 MED ORDER — MAGNESIUM CITRATE PO SOLN: 1.0000 | Freq: Once | ORAL | Status: AC | PRN

## 2014-06-26 MED ORDER — MEPERIDINE HCL 50 MG/ML IJ SOLN: 6.2500 mg | INTRAMUSCULAR | Status: DC | PRN

## 2014-06-26 MED ORDER — ONDANSETRON HCL 4 MG PO TABS: 4.0000 mg | ORAL_TABLET | Freq: Four times a day (QID) | ORAL | Status: DC | PRN

## 2014-06-26 MED ORDER — ACETAMINOPHEN 160 MG/5ML PO SOLN
325.0000 mg | ORAL | Status: DC | PRN
  Filled 2014-06-26: qty 20.3

## 2014-06-26 MED ORDER — POLYETHYLENE GLYCOL 3350 17 G PO PACK
17.0000 g | PACK | Freq: Two times a day (BID) | ORAL | Status: DC
  Administered 2014-06-27 – 2014-06-28 (×3): 17 g via ORAL

## 2014-06-26 MED ORDER — METHOCARBAMOL 500 MG PO TABS
500.0000 mg | ORAL_TABLET | Freq: Four times a day (QID) | ORAL | Status: DC | PRN
  Administered 2014-06-26 – 2014-06-27 (×3): 500 mg via ORAL
  Filled 2014-06-26 (×3): qty 1

## 2014-06-26 MED ORDER — MENTHOL 3 MG MT LOZG
1.0000 | LOZENGE | OROMUCOSAL | Status: DC | PRN
  Filled 2014-06-26: qty 9

## 2014-06-26 MED ORDER — SODIUM CHLORIDE 0.9 % IR SOLN
Status: DC | PRN
  Administered 2014-06-26: 1000 mL

## 2014-06-26 MED ORDER — DEXAMETHASONE SODIUM PHOSPHATE 10 MG/ML IJ SOLN
10.0000 mg | Freq: Once | INTRAMUSCULAR | Status: DC
  Filled 2014-06-26: qty 1

## 2014-06-26 MED ORDER — HYDROMORPHONE HCL 1 MG/ML IJ SOLN
INTRAMUSCULAR | Status: AC
  Filled 2014-06-26: qty 1

## 2014-06-26 MED ORDER — CELECOXIB 200 MG PO CAPS
200.0000 mg | ORAL_CAPSULE | Freq: Two times a day (BID) | ORAL | Status: DC
  Administered 2014-06-26 – 2014-06-28 (×4): 200 mg via ORAL
  Filled 2014-06-26 (×5): qty 1

## 2014-06-26 MED ORDER — PROPOFOL 10 MG/ML IV BOLUS
INTRAVENOUS | Status: AC
  Filled 2014-06-26: qty 20

## 2014-06-26 MED ORDER — CEFAZOLIN SODIUM-DEXTROSE 2-3 GM-% IV SOLR
2.0000 g | Freq: Four times a day (QID) | INTRAVENOUS | Status: AC
  Administered 2014-06-26 – 2014-06-27 (×2): 2 g via INTRAVENOUS
  Filled 2014-06-26 (×2): qty 50

## 2014-06-26 MED ORDER — ONDANSETRON HCL 4 MG/2ML IJ SOLN
INTRAMUSCULAR | Status: AC
  Filled 2014-06-26: qty 2

## 2014-06-26 MED ORDER — DEXAMETHASONE SODIUM PHOSPHATE 10 MG/ML IJ SOLN
10.0000 mg | Freq: Once | INTRAMUSCULAR | Status: AC
  Administered 2014-06-26: 10 mg via INTRAVENOUS

## 2014-06-26 MED ORDER — ONDANSETRON HCL 4 MG/2ML IJ SOLN: 4.0000 mg | Freq: Four times a day (QID) | INTRAMUSCULAR | Status: DC | PRN

## 2014-06-26 MED ORDER — ALUM & MAG HYDROXIDE-SIMETH 200-200-20 MG/5ML PO SUSP
30.0000 mL | ORAL | Status: DC | PRN
  Administered 2014-06-27 (×2): 30 mL via ORAL
  Filled 2014-06-26 (×2): qty 30

## 2014-06-26 MED ORDER — METOCLOPRAMIDE HCL 10 MG PO TABS: 5.0000 mg | ORAL_TABLET | Freq: Three times a day (TID) | ORAL | Status: DC | PRN

## 2014-06-26 MED ORDER — ACETAMINOPHEN 325 MG PO TABS: 325.0000 mg | ORAL_TABLET | ORAL | Status: DC | PRN

## 2014-06-26 MED ORDER — MIDAZOLAM HCL 2 MG/2ML IJ SOLN: 0.5000 mg | Freq: Once | INTRAMUSCULAR | Status: DC | PRN

## 2014-06-26 MED ORDER — MIDAZOLAM HCL 5 MG/5ML IJ SOLN
INTRAMUSCULAR | Status: DC | PRN
  Administered 2014-06-26: 2 mg via INTRAVENOUS

## 2014-06-26 MED ORDER — MIDAZOLAM HCL 2 MG/2ML IJ SOLN
INTRAMUSCULAR | Status: AC
  Filled 2014-06-26: qty 2

## 2014-06-26 MED ORDER — STERILE WATER FOR IRRIGATION IR SOLN
Status: DC | PRN
  Administered 2014-06-26: 1500 mL

## 2014-06-26 MED ORDER — FERROUS SULFATE 325 (65 FE) MG PO TABS
325.0000 mg | ORAL_TABLET | Freq: Three times a day (TID) | ORAL | Status: DC
  Administered 2014-06-27 – 2014-06-28 (×2): 325 mg via ORAL
  Filled 2014-06-26 (×7): qty 1

## 2014-06-26 MED ORDER — LACTATED RINGERS IV SOLN
INTRAVENOUS | Status: DC
  Administered 2014-06-26 (×2): 1000 mL via INTRAVENOUS
  Administered 2014-06-26: 14:00:00 via INTRAVENOUS

## 2014-06-26 MED ORDER — CEFAZOLIN SODIUM-DEXTROSE 2-3 GM-% IV SOLR
2.0000 g | INTRAVENOUS | Status: AC
  Administered 2014-06-26: 2 g via INTRAVENOUS

## 2014-06-26 MED ORDER — DIPHENHYDRAMINE HCL 25 MG PO CAPS: 25.0000 mg | ORAL_CAPSULE | Freq: Four times a day (QID) | ORAL | Status: DC | PRN

## 2014-06-26 MED ORDER — PROMETHAZINE HCL 25 MG/ML IJ SOLN: 6.2500 mg | INTRAMUSCULAR | Status: DC | PRN

## 2014-06-26 MED ORDER — PROPOFOL 10 MG/ML IV BOLUS
INTRAVENOUS | Status: DC | PRN
  Administered 2014-06-26: 20 mg via INTRAVENOUS

## 2014-06-26 MED ORDER — PROPOFOL INFUSION 10 MG/ML OPTIME
INTRAVENOUS | Status: DC | PRN
  Administered 2014-06-26: 75 ug/kg/min via INTRAVENOUS

## 2014-06-26 MED ORDER — ASPIRIN EC 325 MG PO TBEC
325.0000 mg | DELAYED_RELEASE_TABLET | Freq: Two times a day (BID) | ORAL | Status: DC
  Administered 2014-06-27 – 2014-06-28 (×3): 325 mg via ORAL
  Filled 2014-06-26 (×5): qty 1

## 2014-06-26 MED ORDER — METOCLOPRAMIDE HCL 5 MG/ML IJ SOLN: 5.0000 mg | Freq: Three times a day (TID) | INTRAMUSCULAR | Status: DC | PRN

## 2014-06-26 MED ORDER — SODIUM CHLORIDE 0.9 % IV SOLN
100.0000 mL/h | INTRAVENOUS | Status: DC
  Administered 2014-06-26: 100 mL/h via INTRAVENOUS
  Filled 2014-06-26 (×5): qty 1000

## 2014-06-26 MED ORDER — DOCUSATE SODIUM 100 MG PO CAPS
100.0000 mg | ORAL_CAPSULE | Freq: Two times a day (BID) | ORAL | Status: DC
  Administered 2014-06-26 – 2014-06-28 (×4): 100 mg via ORAL

## 2014-06-26 MED ORDER — CEFAZOLIN SODIUM-DEXTROSE 2-3 GM-% IV SOLR
INTRAVENOUS | Status: AC
  Filled 2014-06-26: qty 50

## 2014-06-26 MED ORDER — HYDROMORPHONE HCL 1 MG/ML IJ SOLN
0.2500 mg | INTRAMUSCULAR | Status: DC | PRN
  Administered 2014-06-26 (×2): 0.5 mg via INTRAVENOUS

## 2014-06-26 MED ORDER — CHLORHEXIDINE GLUCONATE 4 % EX LIQD: 60.0000 mL | Freq: Once | CUTANEOUS | Status: DC

## 2014-06-26 MED ORDER — DEXTROSE 5 % IV SOLN
500.0000 mg | Freq: Four times a day (QID) | INTRAVENOUS | Status: DC | PRN
  Administered 2014-06-26: 500 mg via INTRAVENOUS
  Filled 2014-06-26 (×2): qty 5

## 2014-06-26 MED ORDER — TEMAZEPAM 15 MG PO CAPS: 15.0000 mg | ORAL_CAPSULE | Freq: Every evening | ORAL | Status: DC | PRN

## 2014-06-26 MED ORDER — PHENOL 1.4 % MT LIQD
1.0000 | OROMUCOSAL | Status: DC | PRN
  Filled 2014-06-26: qty 177

## 2014-06-26 MED ORDER — ONDANSETRON HCL 4 MG/2ML IJ SOLN
INTRAMUSCULAR | Status: DC | PRN
  Administered 2014-06-26: 4 mg via INTRAVENOUS

## 2014-06-26 SURGICAL SUPPLY — 38 items
BAG SPEC THK2 15X12 ZIP CLS (MISCELLANEOUS)
BAG ZIPLOCK 12X15 (MISCELLANEOUS) IMPLANT
CAPT HIP TOTAL 2 ×1 IMPLANT
COVER PERINEAL POST (MISCELLANEOUS) ×2 IMPLANT
DRAPE C-ARM 42X120 X-RAY (DRAPES) ×2 IMPLANT
DRAPE STERI IOBAN 125X83 (DRAPES) ×2 IMPLANT
DRAPE U-SHAPE 47X51 STRL (DRAPES) ×6 IMPLANT
DRSG AQUACEL AG ADV 3.5X10 (GAUZE/BANDAGES/DRESSINGS) ×2 IMPLANT
DURAPREP 26ML APPLICATOR (WOUND CARE) ×2 IMPLANT
ELECT BLADE TIP CTD 4 INCH (ELECTRODE) ×2 IMPLANT
ELECT PENCIL ROCKER SW 15FT (MISCELLANEOUS) IMPLANT
ELECT REM PT RETURN 15FT ADLT (MISCELLANEOUS) IMPLANT
ELECT REM PT RETURN 9FT ADLT (ELECTROSURGICAL) ×2
ELECTRODE REM PT RTRN 9FT ADLT (ELECTROSURGICAL) ×1 IMPLANT
FACESHIELD WRAPAROUND (MASK) ×8 IMPLANT
FACESHIELD WRAPAROUND OR TEAM (MASK) ×4 IMPLANT
GLOVE BIOGEL PI IND STRL 7.5 (GLOVE) ×1 IMPLANT
GLOVE BIOGEL PI IND STRL 8.5 (GLOVE) ×1 IMPLANT
GLOVE BIOGEL PI INDICATOR 7.5 (GLOVE) ×1
GLOVE BIOGEL PI INDICATOR 8.5 (GLOVE) ×1
GLOVE ECLIPSE 8.0 STRL XLNG CF (GLOVE) ×4 IMPLANT
GLOVE ORTHO TXT STRL SZ7.5 (GLOVE) ×2 IMPLANT
GOWN SPEC L3 XXLG W/TWL (GOWN DISPOSABLE) ×2 IMPLANT
GOWN STRL REUS W/TWL LRG LVL3 (GOWN DISPOSABLE) ×2 IMPLANT
HOLDER FOLEY CATH W/STRAP (MISCELLANEOUS) ×2 IMPLANT
KIT BASIN OR (CUSTOM PROCEDURE TRAY) ×2 IMPLANT
LIQUID BAND (GAUZE/BANDAGES/DRESSINGS) ×2 IMPLANT
PACK TOTAL JOINT (CUSTOM PROCEDURE TRAY) ×2 IMPLANT
SAW OSC TIP CART 19.5X105X1.3 (SAW) ×2 IMPLANT
SUT MNCRL AB 4-0 PS2 18 (SUTURE) ×2 IMPLANT
SUT VIC AB 1 CT1 36 (SUTURE) ×6 IMPLANT
SUT VIC AB 2-0 CT1 27 (SUTURE) ×4
SUT VIC AB 2-0 CT1 TAPERPNT 27 (SUTURE) ×2 IMPLANT
SUT VLOC 180 0 24IN GS25 (SUTURE) ×2 IMPLANT
TOWEL OR 17X26 10 PK STRL BLUE (TOWEL DISPOSABLE) ×2 IMPLANT
TOWEL OR NON WOVEN STRL DISP B (DISPOSABLE) IMPLANT
TRAY FOLEY CATH 14FRSI W/METER (CATHETERS) ×2 IMPLANT
WATER STERILE IRR 1500ML POUR (IV SOLUTION) ×2 IMPLANT

## 2014-06-26 NOTE — Interval H&P Note (Signed)
History and Physical Interval Note:  06/26/2014 12:02 PM  Ariana Ray  has presented today for surgery, with the diagnosis of right hip osteoarthritis  The various methods of treatment have been discussed with the patient and family. After consideration of risks, benefits and other options for treatment, the patient has consented to  Procedure(s): RIGHT TOTAL HIP ARTHROPLASTY ANTERIOR APPROACH (Right) as a surgical intervention .  The patient's history has been reviewed, patient examined, no change in status, stable for surgery.  I have reviewed the patient's chart and labs.  Questions were answered to the patient's satisfaction.     [name]

## 2014-06-26 NOTE — Anesthesia Postprocedure Evaluation (Signed)
  Anesthesia Post Note  Patient: Ariana Ray  Procedure(s) Performed: Procedure(s) (LRB): RIGHT TOTAL HIP ARTHROPLASTY ANTERIOR APPROACH (Right)  Anesthesia type: Spinal  Patient location: PACU  Post pain: Pain level controlled  Post assessment: Post-op Vital signs reviewed  Last Vitals:  Filed Vitals:   06/26/14 1542  BP: 102/59  Pulse: 77  Temp: 36.6 C  Resp: 16    Post vital signs: Reviewed  Level of consciousness: awake  Complications: No apparent anesthesia complications

## 2014-06-26 NOTE — Transfer of Care (Signed)
Immediate Anesthesia Transfer of Care Note  Patient: Ariana Ray  Procedure(s) Performed: Procedure(s): RIGHT TOTAL HIP ARTHROPLASTY ANTERIOR APPROACH (Right)  Patient Location: PACU  Anesthesia Type:Spinal  Level of Consciousness: awake, alert  and oriented  Airway & Oxygen Therapy: Patient Spontanous Breathing and Patient connected to face mask oxygen  Post-op Assessment: Report given to PACU RN and Post -op Vital signs reviewed and stable  Post vital signs: Reviewed and stable  Complications: No apparent anesthesia complications

## 2014-06-26 NOTE — Anesthesia Procedure Notes (Signed)
Spinal Patient location during procedure: OR Start time: 06/26/2014 2:04 PM Staffing Anesthesiologist: Rudean Curt Performed by: anesthesiologist  Preanesthetic Checklist Completed: patient identified, site marked, surgical consent, pre-op evaluation, timeout performed, IV checked, risks and benefits discussed and monitors and equipment checked Spinal Block Patient position: sitting Prep: DuraPrep Patient monitoring: heart rate, cardiac monitor, continuous pulse ox and blood pressure Approach: midline Location: L3-4 Injection technique: single-shot Needle Needle type: Sprotte  Needle gauge: 24 G Needle length: 9 cm Assessment Sensory level: T4 Additional Notes Patient identified.  Risk benefits discussed including failed block, incomplete pain control, headache, nerve damage, paralysis, blood pressure changes, nausea, vomiting, reactions to medication both toxic or allergic, and postpartum back pain.  Patient expressed understanding and wished to proceed.  All questions were answered.  Sterile technique used throughout procedure.  CSF was clear.  No parasthesia or other complications.  Please see nursing notes for vital signs.

## 2014-06-26 NOTE — Plan of Care (Signed)
Problem: Consults Goal: Diagnosis- Total Joint Replacement Right anterior hip     

## 2014-06-26 NOTE — Progress Notes (Signed)
Utilization review completed.  

## 2014-06-26 NOTE — Anesthesia Preprocedure Evaluation (Signed)
Anesthesia Evaluation  Patient identified by MRN, date of birth, ID band Patient awake    Reviewed: Allergy & Precautions, H&P , Patient's Chart, lab work & pertinent test results  Airway Mallampati: II       Dental   Pulmonary  breath sounds clear to auscultation        Cardiovascular Exercise Tolerance: Good Rhythm:regular Rate:Normal     Neuro/Psych negative psych ROS   GI/Hepatic GERD-  Controlled,  Endo/Other    Renal/GU      Musculoskeletal  (+) Arthritis -,   Abdominal   Peds  Hematology   Anesthesia Other Findings   Reproductive/Obstetrics                             Anesthesia Physical Anesthesia Plan  ASA: II  Anesthesia Plan: Spinal   Post-op Pain Management:    Induction:   Airway Management Planned:   Additional Equipment:   Intra-op Plan:   Post-operative Plan:   Informed Consent: I have reviewed the patients History and Physical, chart, labs and discussed the procedure including the risks, benefits and alternatives for the proposed anesthesia with the patient or authorized representative who has indicated his/her understanding and acceptance.     Plan Discussed with: Anesthesiologist, CRNA and Surgeon  Anesthesia Plan Comments:         Anesthesia Quick Evaluation

## 2014-06-26 NOTE — Op Note (Signed)
NAME:  Ariana Ray                ACCOUNT NO.: [id number]      MEDICAL RECORD NO.: [id number]      FACILITY:  [facility]      PHYSICIAN:  Paralee Cancel D  DATE OF BIRTH:  October 04, 1932     DATE OF PROCEDURE:  06/26/2014                                 OPERATIVE REPORT         PREOPERATIVE DIAGNOSIS: Right  hip osteoarthritis.      POSTOPERATIVE DIAGNOSIS:  Right hip osteoarthritis.      PROCEDURE:  Right total hip replacement through an anterior approach   utilizing DePuy THR system, component size 62mm pinnacle cup, a size 36+4 neutral   Altrex liner, a size 4 Hi Tri Lock stem with a 36+1.5 delta ceramic   ball.      SURGEON:  Pietro Cassis. Alvan Dame, M.D.      ASSISTANT:  Danae Orleans, PA-C      ANESTHESIA:  Spinal.      SPECIMENS:  None.      COMPLICATIONS:  None.      BLOOD LOSS:  450 cc     DRAINS:  None.      INDICATION OF THE PROCEDURE:  Ariana Ray is a 81 y.o. female who had   presented to office for evaluation of right hip pain.  Radiographs revealed   progressive degenerative changes with bone-on-bone   articulation to the  hip joint.  The patient had painful limited range of   motion significantly affecting their overall quality of life.  The patient was failing to    respond to conservative measures, and at this point was ready   to proceed with more definitive measures.  The patient has noted progressive   degenerative changes in his hip, progressive problems and dysfunction   with regarding the hip prior to surgery.  Consent was obtained for   benefit of pain relief.  Specific risk of infection, DVT, component   failure, dislocation, need for revision surgery, as well discussion of   the anterior versus posterior approach were reviewed.  Consent was   obtained for benefit of anterior pain relief through an anterior   approach.      PROCEDURE IN DETAIL:  The patient was brought to operative theater.   Once adequate anesthesia,  preoperative antibiotics, 2gm of Ancef administered.   The patient was positioned supine on the OSI Hanna table.  Once adequate   padding of boney process was carried out, we had predraped out the hip, and  used fluoroscopy to confirm orientation of the pelvis and position.      The right hip was then prepped and draped from proximal iliac crest to   mid thigh with shower curtain technique.      Time-out was performed identifying the patient, planned procedure, and   extremity.     An incision was then made 2 cm distal and lateral to the   anterior superior iliac spine extending over the orientation of the   tensor fascia lata muscle and sharp dissection was carried down to the   fascia of the muscle and protractor placed in the soft tissues.      The fascia was then incised.  The muscle belly was identified and  swept   laterally and retractor placed along the superior neck.  Following   cauterization of the circumflex vessels and removing some pericapsular   fat, a second cobra retractor was placed on the inferior neck.  A third   retractor was placed on the anterior acetabulum after elevating the   anterior rectus.  A L-capsulotomy was along the line of the   superior neck to the trochanteric fossa, then extended proximally and   distally.  Tag sutures were placed and the retractors were then placed   intracapsular.  We then identified the trochanteric fossa and   orientation of my neck cut, confirmed this radiographically   and then made a neck osteotomy with the femur on traction.  The femoral   head was removed without difficulty or complication.  Traction was let   off and retractors were placed posterior and anterior around the   acetabulum.      The labrum and foveal tissue were debrided.  I began reaming with a 2mm   reamer and reamed up to 61mm reamer with good bony bed preparation and a 83mm   cup was chosen.  The final 45mm Pinnacle cup was then impacted under  fluoroscopy  to confirm the depth of penetration and orientation with respect to   abduction.  A screw was placed followed by the hole eliminator.  The final   36+4 neutral Altrex liner was impacted with good visualized rim fit.  The cup was positioned anatomically within the acetabular portion of the pelvis.      At this point, the femur was rolled at 80 degrees.  Further capsule was   released off the inferior aspect of the femoral neck.  I then   released the superior capsule proximally.  The hook was placed laterally   along the femur and elevated manually and held in position with the bed   hook.  The leg was then extended and adducted with the leg rolled to 100   degrees of external rotation.  Once the proximal femur was fully   exposed, I used a box osteotome to set orientation.  I then began   broaching with the starting chili pepper broach and passed this by hand and then broached up to 4.  With the 4 broach in place I chose a high offset neck and did a trial reduction.  The offset was appropriate, leg lengths   appeared to be equal, confirmed radiographically.   Given these findings, I went ahead and dislocated the hip, repositioned all   retractors and positioned the right hip in the extended and abducted position.  The final 4 Hi Tri Lock stem was   chosen and it was impacted down to the level of neck cut.  Based on this   and the trial reduction, a 36+1.5 delta ceramic ball was chosen and   impacted onto a clean and dry trunnion, and the hip was reduced.  The   hip had been irrigated throughout the case again at this point.  I did   reapproximate the superior capsular leaflet to the anterior leaflet   using #1 Vicryl.  The fascia of the   tensor fascia lata muscle was then reapproximated using #1 Vicryl and #0 V-lock sutures.  The   remaining wound was closed with 2-0 Vicryl and running 4-0 Monocryl.   The hip was cleaned, dried, and dressed sterilely using Dermabond and    Aquacel dressing.  She was then brought  to recovery room in stable condition tolerating the procedure well.    Danae Orleans, PA-C was present for the entirety of the case involved from   preoperative positioning, perioperative retractor management, general   facilitation of the case, as well as primary wound closure as assistant.            Pietro Cassis Alvan Dame, M.D.        06/26/2014 3:35 PM

## 2014-06-27 ENCOUNTER — Encounter (HOSPITAL_COMMUNITY): Payer: Self-pay | Admitting: Orthopedic Surgery

## 2014-06-27 LAB — BASIC METABOLIC PANEL
ANION GAP: 6 (ref 5–15)
BUN: 11 mg/dL (ref 6–23)
CALCIUM: 8.3 mg/dL — AB (ref 8.4–10.5)
CHLORIDE: 102 meq/L (ref 96–112)
CO2: 25 mmol/L (ref 19–32)
Creatinine, Ser: 0.66 mg/dL (ref 0.50–1.10)
GFR calc Af Amer: >90 mL/min (ref >90)
GFR, EST NON AFRICAN AMERICAN: 82 mL/min — AB (ref >90)
Glucose, Bld: 149 mg/dL — ABNORMAL HIGH (ref 70–99)
Potassium: 4.4 mmol/L (ref 3.5–5.1)
Sodium: 133 mmol/L — ABNORMAL LOW (ref 135–145)

## 2014-06-27 LAB — CBC
HCT: 29.8 % — ABNORMAL LOW (ref 36.0–46.0)
Hemoglobin: 9.8 g/dL — ABNORMAL LOW (ref 12.0–15.0)
MCH: 29 pg (ref 26.0–34.0)
MCHC: 32.9 g/dL (ref 30.0–36.0)
MCV: 88.2 fL (ref 78.0–100.0)
PLATELETS: 224 10*3/uL (ref 150–400)
RBC: 3.38 MIL/uL — ABNORMAL LOW (ref 3.87–5.11)
RDW: 13.4 % (ref 11.5–15.5)
WBC: 9.9 10*3/uL (ref 4.0–10.5)

## 2014-06-27 MED ORDER — FERROUS SULFATE 325 (65 FE) MG PO TABS: 325.0000 mg | ORAL_TABLET | Freq: Three times a day (TID) | ORAL | Status: DC

## 2014-06-27 MED ORDER — HYDROCODONE-ACETAMINOPHEN 7.5-325 MG PO TABS: 1.0000 | ORAL_TABLET | ORAL | Status: DC | PRN

## 2014-06-27 MED ORDER — ASPIRIN 325 MG PO TBEC: 325.0000 mg | DELAYED_RELEASE_TABLET | Freq: Two times a day (BID) | ORAL | Status: DC

## 2014-06-27 MED ORDER — POLYETHYLENE GLYCOL 3350 17 G PO PACK: 17.0000 g | PACK | Freq: Two times a day (BID) | ORAL | Status: AC

## 2014-06-27 MED ORDER — DSS 100 MG PO CAPS: 100.0000 mg | ORAL_CAPSULE | Freq: Two times a day (BID) | ORAL | Status: DC

## 2014-06-27 MED ORDER — TIZANIDINE HCL 4 MG PO TABS: 4.0000 mg | ORAL_TABLET | Freq: Four times a day (QID) | ORAL | Status: DC | PRN

## 2014-06-27 NOTE — Progress Notes (Signed)
[facility]  [facility] is providing the following services: RW and Commode  If patient discharges after hours, please call [phone].   Ariana Ray 06/27/2014, 10:50 AM

## 2014-06-27 NOTE — Progress Notes (Signed)
CSW consulted for SNF placement. PN reviewed. Spoke with MD / RNCM. Pt plans to d/c home following hospital d/c. RNCM is assisting with d/c planning. CSW signing off at this time.  Werner Lean LCSW [phone]

## 2014-06-27 NOTE — Progress Notes (Signed)
Physical Therapy Treatment Patient Details Name: Ariana Ray MRN: [id number] DOB: 07/04/1932 Today's Date: 2014-07-15    History of Present Illness R THR    PT Comments    Progressing well and hoping for d/c home tomorrow.  Follow Up Recommendations  Home health PT     Equipment Recommendations  Rolling walker with 5" wheels    Recommendations for Other Services OT consult     Precautions / Restrictions Precautions Precautions: Fall Restrictions Weight Bearing Restrictions: No Other Position/Activity Restrictions: WBAT    Mobility  Bed Mobility Overal bed mobility: Needs Assistance Bed Mobility: Supine to Sit     Supine to sit: Min guard        Transfers Overall transfer level: Needs assistance Equipment used: Rolling walker (2 wheeled) Transfers: Sit to/from Stand Sit to Stand: Min guard         General transfer comment: cues for LE management and use of UEs to self assist  Ambulation/Gait Ambulation/Gait assistance: Min guard Ambulation Distance (Feet): 300 Feet Assistive device: Rolling walker (2 wheeled) Gait Pattern/deviations: Step-through pattern;Decreased step length - right;Decreased step length - left;Shuffle;Trunk flexed Gait velocity: decr   General Gait Details: cues for posture and position from [employer]            Wheelchair Mobility    Modified Rankin (Stroke Patients Only)       Balance                                    Cognition Arousal/Alertness: Awake/alert Behavior During Therapy: WFL for tasks assessed/performed Overall Cognitive Status: Within Functional Limits for tasks assessed                      Exercises Total Joint Exercises Ankle Circles/Pumps: AROM;15 reps;Supine;Both Quad Sets: AROM;10 reps;Supine;Both Heel Slides: AAROM;Right;15 reps;Supine Hip ABduction/ADduction: AAROM;Right;10 reps;Supine    General Comments        Pertinent Vitals/Pain Pain Assessment:  0-10 Pain Score: 3  Pain Location: R hip/thigh Pain Descriptors / Indicators: Aching;Burning Pain Intervention(s): Limited activity within patient's tolerance;Monitored during session;Premedicated before session    Home Living                      Prior Function            PT Goals (current goals can now be found in the care plan section) Acute Rehab PT Goals Patient Stated Goal: Resume previous lifestyle with decreased pain PT Goal Formulation: With patient Time For Goal Achievement: 07/02/14 Potential to Achieve Goals: Good Progress towards PT goals: Progressing toward goals    Frequency  7X/week    PT Plan Current plan remains appropriate    Co-evaluation             End of Session Equipment Utilized During Treatment: Gait belt Activity Tolerance: Patient tolerated treatment well Patient left: in chair;with call bell/phone within reach;with family/visitor present     Time: 1343-1406 PT Time Calculation (min) (ACUTE ONLY): 23 min  Charges:  $Gait Training: 8-22 mins $Therapeutic Exercise: 8-22 mins                    G Codes:      Josseline Reddin July 15, 2014, 3:33 PM

## 2014-06-27 NOTE — Progress Notes (Signed)
     Subjective: 1 Day Post-Op Procedure(s) (LRB): RIGHT TOTAL HIP ARTHROPLASTY ANTERIOR APPROACH (Right)   Patient reports pain as mild, pain controlled. No events throughout the night. Looking forward to working with PT.  Objective:   VITALS:   Filed Vitals:   06/27/14 0623  BP: 98/57  Pulse: 63  Temp: 98.2 F (36.8 C)  Resp: 16    Dorsiflexion/Plantar flexion intact Incision: dressing C/D/I No cellulitis present Compartment soft  LABS  Recent Labs  06/27/14 0410  HGB 9.8*  HCT 29.8*  WBC 9.9  PLT 224     Recent Labs  06/27/14 0410  NA 133*  K 4.4  BUN 11  CREATININE 0.66  GLUCOSE 149*     Assessment/Plan: 1 Day Post-Op Procedure(s) (LRB): RIGHT TOTAL HIP ARTHROPLASTY ANTERIOR APPROACH (Right) Foley cath d/c'ed Advance diet Up with therapy D/C IV fluids Discharge home with home health when ready       [place] Pugh. Sandhya Denherder   PAC  06/27/2014, 8:27 AM

## 2014-06-27 NOTE — Plan of Care (Signed)
Problem: Consults Goal: Diagnosis- Total Joint Replacement Outcome: Completed/Met Date Met:  06/27/14 Primary Total Hip RIGHT, Anterior

## 2014-06-27 NOTE — Evaluation (Signed)
Physical Therapy Evaluation Patient Details Name: Ariana Ray MRN: [id number] DOB: May 28, 1933 Today's Date: 06/27/2014   History of Present Illness  R THR  Clinical Impression  Pt s/p R THR presents with decreased R LE strength/ROM and post op pain limiting functional mobility.  Pt should progress well to d.c home with family assist and HHPT follow up.    Follow Up Recommendations Home health PT    Equipment Recommendations  Rolling walker with 5" wheels    Recommendations for Other Services OT consult     Precautions / Restrictions Precautions Precautions: Fall Restrictions Weight Bearing Restrictions: No Other Position/Activity Restrictions: WBAT      Mobility  Bed Mobility Overal bed mobility: Needs Assistance Bed Mobility: Supine to Sit     Supine to sit: Min assist     General bed mobility comments: cues for sequence and use of L LE to self assist  Transfers Overall transfer level: Needs assistance Equipment used: Rolling walker (2 wheeled) Transfers: Sit to/from Stand Sit to Stand: Min assist         General transfer comment: cues for LE management and use of UEs to self assist  Ambulation/Gait Ambulation/Gait assistance: Min assist Ambulation Distance (Feet): 115 Feet (and 15' to bathroom) Assistive device: Rolling walker (2 wheeled) Gait Pattern/deviations: Step-to pattern;Step-through pattern;Decreased step length - right;Decreased step length - left;Shuffle;Trunk flexed Gait velocity: decr      Stairs            Wheelchair Mobility    Modified Rankin (Stroke Patients Only)       Balance                                             Pertinent Vitals/Pain Pain Assessment: 0-10 Pain Score: 3  Pain Location: R hip/thigh Pain Descriptors / Indicators: Aching;Burning Pain Intervention(s): Limited activity within patient's tolerance;Monitored during session;Premedicated before session;Ice applied    Home  Living Family/patient expects to be discharged to:: Private residence Living Arrangements: Alone Available Help at Discharge: Family;Personal care attendant Type of Home: House Home Access: Stairs to enter Entrance Stairs-Rails: None Entrance Stairs-Number of Steps: 1 Home Layout: One level Home Equipment: None Additional Comments: Dtr and hired attendant will assist    Prior Function Level of Independence: Independent               Hand Dominance        Extremity/Trunk Assessment   Upper Extremity Assessment: Overall WFL for tasks assessed           Lower Extremity Assessment: RLE deficits/detail RLE Deficits / Details: Hip strength 2+/5 with AAROM at hip to 95 flex and 20 abd    Cervical / Trunk Assessment: Normal  Communication   Communication: No difficulties  Cognition Arousal/Alertness: Awake/alert Behavior During Therapy: WFL for tasks assessed/performed Overall Cognitive Status: Within Functional Limits for tasks assessed                      General Comments      Exercises Total Joint Exercises Ankle Circles/Pumps: AROM;15 reps;Supine;Both Quad Sets: AROM;10 reps;Supine;Both Heel Slides: AAROM;Right;15 reps;Supine Hip ABduction/ADduction: AAROM;Right;10 reps;Supine      Assessment/Plan    PT Assessment Patient needs continued PT services  PT Diagnosis Difficulty walking   PT Problem List Decreased strength;Decreased range of motion;Decreased activity tolerance;Decreased mobility;Decreased knowledge of use of DME;Pain  PT Treatment Interventions DME instruction;Gait training;Stair training;Functional mobility training;Therapeutic activities;Therapeutic exercise;Patient/family education   PT Goals (Current goals can be found in the Care Plan section) Acute Rehab PT Goals Patient Stated Goal: Resume previous lifestyle with decreased pain PT Goal Formulation: With patient Time For Goal Achievement: 07/02/14 Potential to Achieve Goals:  Good    Frequency 7X/week   Barriers to discharge        Co-evaluation               End of Session Equipment Utilized During Treatment: Gait belt Activity Tolerance: Patient tolerated treatment well Patient left: in chair;with call bell/phone within reach;with family/visitor present Nurse Communication: Mobility status         Time: 2683-4196 PT Time Calculation (min) (ACUTE ONLY): 34 min   Charges:   PT Evaluation $Initial PT Evaluation Tier I: 1 Procedure PT Treatments $Gait Training: 8-22 mins $Therapeutic Exercise: 8-22 mins   PT G Codes:        Cloa Bushong 07-14-14, 12:12 PM

## 2014-06-27 NOTE — Care Management Note (Signed)
    Page 1 of 2   06/27/2014     10:41:58 AM CARE MANAGEMENT NOTE 06/27/2014  Patient:  Ariana Ray, Ariana Ray   Account Number:  [id number]  Date Initiated:  06/27/2014  Documentation initiated by:  [facility]  Subjective/Objective Assessment:   adm: RIGHT TOTAL HIP ARTHROPLASTY ANTERIOR APPROACH (Right)     Action/Plan:   discharge planning   Anticipated DC Date:  06/28/2014   Anticipated DC Plan:  [place]  CM consult      [facility] Choice  HOME HEALTH   Choice offered to / List presented to:  C-1 Patient   DME arranged  3-N-1  Vassie Moselle      DME agency  [place] arranged  [place]   Status of service:  Completed, signed off Medicare Important Message given?   (If response is "NO", the following Medicare IM given date fields will be blank) Date Medicare IM given:   Medicare IM given by:   Date Additional Medicare IM given:   Additional Medicare IM given by:    Discharge Disposition:  [place]  Per UR Regulation:    If discussed at Long Length of Stay Meetings, dates discussed:    Comments:  06/27/14 08:00 CM met with pt to offer choice of home health agency.  Pt chooses [facility] to render HHPT.  CM called [facility] DME rep to please deliver 3n1 and rolling walker to room prior to discharge.  referral given to [employer], Tim. No other CM needs were communicated.  Mariane Masters, BSN, CM [phone].

## 2014-06-27 NOTE — Progress Notes (Signed)
OT Cancellation Note  Patient Details Name: ADJOA ALTHOUSE MRN: [id number] DOB: [birth year]   Cancelled Treatment:    Reason Eval/Treat Not Completed: Other (comment) Pt just back to bed.  Spoke about role of OT. Will complete OT education in the morning. Thanks, Kari Baars, [place]  Payton Mccallum D 06/27/2014, 12:41 PM

## 2014-06-28 LAB — CBC
HEMATOCRIT: 27.5 % — AB (ref 36.0–46.0)
HEMOGLOBIN: 8.8 g/dL — AB (ref 12.0–15.0)
MCH: 28.5 pg (ref 26.0–34.0)
MCHC: 32 g/dL (ref 30.0–36.0)
MCV: 89 fL (ref 78.0–100.0)
Platelets: 197 10*3/uL (ref 150–400)
RBC: 3.09 MIL/uL — ABNORMAL LOW (ref 3.87–5.11)
RDW: 13.8 % (ref 11.5–15.5)
WBC: 7.4 10*3/uL (ref 4.0–10.5)

## 2014-06-28 LAB — BASIC METABOLIC PANEL
ANION GAP: 4 — AB (ref 5–15)
BUN: 17 mg/dL (ref 6–23)
CALCIUM: 8 mg/dL — AB (ref 8.4–10.5)
CHLORIDE: 107 meq/L (ref 96–112)
CO2: 28 mmol/L (ref 19–32)
Creatinine, Ser: 0.65 mg/dL (ref 0.50–1.10)
GFR calc Af Amer: >90 mL/min (ref >90)
GFR calc non Af Amer: 82 mL/min — ABNORMAL LOW (ref >90)
GLUCOSE: 95 mg/dL (ref 70–99)
POTASSIUM: 4.1 mmol/L (ref 3.5–5.1)
Sodium: 139 mmol/L (ref 135–145)

## 2014-06-28 NOTE — Progress Notes (Signed)
     Subjective: 2 Days Post-Op Procedure(s) (LRB): RIGHT TOTAL HIP ARTHROPLASTY ANTERIOR APPROACH (Right)   Patient reports pain as mild, pain controlled. No events throughout the night. Doing well with mobility. Ready to be discharged home.   Objective:   VITALS:   Filed Vitals:   06/27/14 2127  BP: 102/47  Pulse: 83  Temp: 98.3 F (36.8 C)  Resp: 16    Dorsiflexion/Plantar flexion intact Incision: dressing C/D/I No cellulitis present Compartment soft  LABS  Recent Labs  06/27/14 0410 06/28/14 0455  HGB 9.8* 8.8*  HCT 29.8* 27.5*  WBC 9.9 7.4  PLT 224 197     Recent Labs  06/27/14 0410 06/28/14 0455  NA 133* 139  K 4.4 4.1  BUN 11 17  CREATININE 0.66 0.65  GLUCOSE 149* 95     Assessment/Plan: 2 Days Post-Op Procedure(s) (LRB): RIGHT TOTAL HIP ARTHROPLASTY ANTERIOR APPROACH (Right) Up with therapy Discharge home with home health  Follow up in 2 weeks at [facility]. Follow up with OLIN,Arlando Leisinger D in 2 weeks.  Contact information:  [facility] [address] [place]   PAC  06/28/2014, 7:58 AM

## 2014-06-28 NOTE — Progress Notes (Signed)
Physical Therapy Treatment Patient Details Name: Ariana Ray MRN: [id number] DOB: March 20, 1933 Today's Date: Jul 14, 2014    History of Present Illness R THR    PT Comments    Progressing well and eager for home.  Reviewed stairs and car transfers  Follow Up Recommendations  Home health PT     Equipment Recommendations  Rolling walker with 5" wheels    Recommendations for Other Services       Precautions / Restrictions Precautions Precautions: Fall Restrictions Weight Bearing Restrictions: No Other Position/Activity Restrictions: WBAT    Mobility  Bed Mobility Overal bed mobility: Modified Independent Bed Mobility: Supine to Sit     Supine to sit: Modified independent (Device/Increase time)        Transfers Overall transfer level: Needs assistance Equipment used: Rolling walker (2 wheeled) Transfers: Sit to/from Stand Sit to Stand: Supervision         General transfer comment: cues for use of UEs to self assist  Ambulation/Gait Ambulation/Gait assistance: Supervision Ambulation Distance (Feet): 350 Feet Assistive device: Rolling walker (2 wheeled) Gait Pattern/deviations: Step-through pattern;Decreased step length - right;Decreased step length - left;Shuffle     General Gait Details: cues for posture and position from RW   Stairs Stairs: Yes Stairs assistance: Min guard Stair Management: No rails;Step to pattern;Forwards;With walker Number of Stairs: 1 (single step twice) General stair comments: cues for sequence and foot/RW placement  Wheelchair Mobility    Modified Rankin (Stroke Patients Only)       Balance                                    Cognition Arousal/Alertness: Awake/alert Behavior During Therapy: WFL for tasks assessed/performed Overall Cognitive Status: Within Functional Limits for tasks assessed                      Exercises Total Joint Exercises Ankle Circles/Pumps: AROM;15  reps;Supine;Both Quad Sets: AROM;10 reps;Supine;Both Heel Slides: AAROM;Right;Supine;20 reps Hip ABduction/ADduction: AAROM;Right;Supine;15 reps Long Arc Quad: AROM;Both;15 reps;Seated    General Comments        Pertinent Vitals/Pain Pain Assessment: 0-10 Pain Score: 3  Pain Location: R hip/thigh Pain Descriptors / Indicators: Aching Pain Intervention(s): Limited activity within patient's tolerance;Monitored during session;Premedicated before session    Home Living                      Prior Function            PT Goals (current goals can now be found in the care plan section) Acute Rehab PT Goals Patient Stated Goal: Resume previous lifestyle with decreased pain PT Goal Formulation: With patient Time For Goal Achievement: 07/02/14 Potential to Achieve Goals: Good Progress towards PT goals: Progressing toward goals    Frequency  7X/week    PT Plan Current plan remains appropriate    Co-evaluation             End of Session Equipment Utilized During Treatment: Gait belt Activity Tolerance: Patient tolerated treatment well Patient left: in chair;with call bell/phone within reach     Time: 4270-6237 PT Time Calculation (min) (ACUTE ONLY): 26 min  Charges:  $Gait Training: 8-22 mins $Therapeutic Exercise: 8-22 mins                    G Codes:      Schae Cando 07-14-2014, 10:26 AM

## 2014-06-28 NOTE — Discharge Summary (Signed)
Physician Discharge Summary  Patient ID: Ariana Ray MRN: [id number] DOB/AGE: Sep 08, 1932 81 y.o.  Admit date: 06/26/2014 Discharge date: 06/28/2014   Procedures:  Procedure(s) (LRB): RIGHT TOTAL HIP ARTHROPLASTY ANTERIOR APPROACH (Right)  Attending Physician:  Dr. Paralee Cancel   Admission Diagnoses:   Right hip primary OA / pain  Discharge Diagnoses:  Principal Problem:   S/P right THA, AA  Past Medical History  Diagnosis Date  . GERD (gastroesophageal reflux disease)     hx of   . Arthritis     HPI: Ariana Ray, 81 y.o. female, has a history of pain and functional disability in the right hip(s) due to arthritis and patient has failed non-surgical conservative treatments for greater than 12 weeks to include NSAID's and/or analgesics, corticosteriod injections, use of assistive devices and activity modification. Onset of symptoms was gradual starting ~1 years ago with gradually worsening course since that time.The patient noted no past surgery on the right hip(s). Patient currently rates pain in the right hip at 8 out of 10 with activity. Patient has night pain, worsening of pain with activity and weight bearing, trendelenberg gait, pain that interfers with activities of daily living and pain with passive range of motion. Patient has evidence of periarticular osteophytes and joint space narrowing by imaging studies. This condition presents safety issues increasing the risk of falls. There is no current active infection. Risks, benefits and expectations were discussed with the patient. Risks including but not limited to the risk of anesthesia, blood clots, nerve damage, blood vessel damage, failure of the prosthesis, infection and up to and including death. Patient understand the risks, benefits and expectations and wishes to proceed with surgery.   PCP: Tommy Medal, MD   Discharged Condition: good  Hospital Course:  Patient underwent the above stated procedure on  06/26/2014. Patient tolerated the procedure well and brought to the recovery room in good condition and subsequently to the floor.  POD #1 BP: 98/57 ; Pulse: 63 ; Temp: 98.2 F (36.8 C) ; Resp: 16 Patient reports pain as mild, pain controlled. No events throughout the night. Looking forward to working with PT.   LABS  Basename    HGB  9.8  HCT  29.8   POD #2  BP: 102/47 ; Pulse: 83 ; Temp: 98.3 F (36.8 C) ; Resp: 16 Patient reports pain as mild, pain controlled. No events throughout the night. Doing well with mobility. Ready to be discharged home.  Dorsiflexion/plantar flexion intact, incision: dressing C/D/I, no cellulitis present and compartment soft.   LABS  Basename    HGB  8.8  HCT  27.5    Discharge Exam: General appearance: alert, cooperative and no distress Extremities: Homans sign is negative, no sign of DVT, no edema, redness or tenderness in the calves or thighs and no ulcers, gangrene or trophic changes  Disposition: Home with follow up in 2 weeks   Follow-up Information    Follow up with [name], MD. Schedule an appointment as soon as possible for a visit in 2 weeks.   Specialty:  Orthopedic Surgery   Contact information:   [address] [place] [address] [phone]       Follow up with [facility].   Why:  home health physical therapy   Contact information:   [place] [address] [phone]       Follow up with [facility].   Why:  3n1 (comode)  and rolling walker    Contact information:   [address] [place] [address] [phone]       Discharge Instructions    Call MD / Call 911    Complete by:  As directed   If you experience chest pain or shortness of breath, CALL 911 and be transported to the hospital emergency room.  If you develope a fever above 101 F, pus (white drainage) or increased drainage or redness at the wound, or calf pain, call your  surgeon's office.     Change dressing    Complete by:  As directed   Maintain surgical dressing until follow up in the clinic. If the edges start to pull up, may reinforce with tape. If the dressing is no longer working, may remove and cover with gauze and tape, but must keep the area dry and clean.  Call with any questions or concerns.     Constipation Prevention    Complete by:  As directed   Drink plenty of fluids.  Prune juice may be helpful.  You may use a stool softener, such as Colace (over the counter) 100 mg twice a day.  Use MiraLax (over the counter) for constipation as needed.     Diet - low sodium heart healthy    Complete by:  As directed      Discharge instructions    Complete by:  As directed   Maintain surgical dressing until follow up in the clinic. If the edges start to pull up, may reinforce with tape. If the dressing is no longer working, may remove and cover with gauze and tape, but must keep the area dry and clean.  Follow up in 2 weeks at [facility]. Call with any questions or concerns.     Increase activity slowly as tolerated    Complete by:  As directed      TED hose    Complete by:  As directed   Use stockings (TED hose) for 2 weeks on both leg(s).  You may remove them at night for sleeping.     Weight bearing as tolerated    Complete by:  As directed   Laterality:  right  Extremity:  Lower             Medication List    STOP taking these medications        ALEVE 220 MG tablet  Generic drug:  naproxen sodium     meloxicam 15 MG tablet  Commonly known as:  MOBIC      TAKE these medications        aspirin 325 MG EC tablet  Take 1 tablet (325 mg total) by mouth 2 (two) times daily.     b complex vitamins tablet  Take 1 tablet by mouth every morning.     CALCIUM CITRATE PO  Take 1 tablet by mouth 2 (two) times daily.     beta carotene w/minerals tablet  Take 1 tablet by mouth every morning.     CENTRUM SILVER ADULT 50+ PO  Take  1 tablet by mouth every morning.     DSS 100 MG Caps  Take 100 mg by mouth 2 (two) times daily.     ferrous sulfate 325 (65 FE) MG tablet  Take 1 tablet (325 mg total) by mouth 3 (three) times daily after meals.     Fish Oil 1200 MG Caps  Take 1 capsule by mouth 2 (two) times daily.  Ginger Root 550 MG Caps  Take 1 capsule by mouth every morning.     HYDROcodone-acetaminophen 7.5-325 MG per tablet  Commonly known as:  NORCO  Take 1-2 tablets by mouth every 4 (four) hours as needed for moderate pain.     polyethylene glycol packet  Commonly known as:  MIRALAX / GLYCOLAX  Take 17 g by mouth 2 (two) times daily.     temazepam 15 MG capsule  Commonly known as:  RESTORIL  Take 15 mg by mouth at bedtime as needed for sleep.     tiZANidine 4 MG tablet  Commonly known as:  ZANAFLEX  Take 1 tablet (4 mg total) by mouth every 6 (six) hours as needed for muscle spasms.     Vitamin D3 2000 UNITS Tabs  Take 1 tablet by mouth every morning.         Signed: [place] Pugh. Shariece Viveiros   PA-C  06/28/2014, 2:03 PM

## 2014-06-28 NOTE — Evaluation (Signed)
Occupational Therapy Evaluation Patient Details Name: Ariana Ray MRN: [id number] DOB: 03/28/1933 Today's Date: 2014-07-02    History of Present Illness R THR   Clinical Impression   All education complete regarding regarding OT activity s/p THA    Follow Up Recommendations  No OT follow up          Precautions / Restrictions Precautions Precautions: Fall Restrictions Weight Bearing Restrictions: No Other Position/Activity Restrictions: WBAT      Mobility Bed Mobility Overal bed mobility: Modified Independent Bed Mobility: Supine to Sit     Supine to sit: Modified independent (Device/Increase time)        Transfers Overall transfer level: Needs assistance Equipment used: Rolling walker (2 wheeled) Transfers: Sit to/from Stand Sit to Stand: Supervision         General transfer comment: cues for use of UEs to self assist    Balance                                            ADL Overall ADL's : Needs assistance/impaired     Grooming: Standing       Lower Body Bathing: Supervison/ safety       Lower Body Dressing: Sit to/from stand;Minimal assistance   Toilet Transfer: RW;Supervision/safety   Toileting- Clothing Manipulation and Hygiene: Sit to/from stand;Supervision/safety       Functional mobility during ADLs: Supervision/safety             Praxis      Pertinent Vitals/Pain Pain Assessment: No/denies pain Pain Score: 3  Pain Location: r thigh Pain Descriptors / Indicators: Aching Pain Intervention(s): Monitored during session     Hand Dominance     Extremity/Trunk Assessment Upper Extremity Assessment Upper Extremity Assessment: Overall WFL for tasks assessed           Communication Communication Communication: No difficulties   Cognition Arousal/Alertness: Awake/alert Behavior During Therapy: WFL for tasks assessed/performed Overall Cognitive Status: Within Functional Limits for tasks  assessed                     General Comments       Exercises Exercises: Total Joint     Shoulder Instructions      Home Living Family/patient expects to be discharged to:: Private residence Living Arrangements: Alone Available Help at Discharge: Family;Personal care attendant Type of Home: House Home Access: Stairs to enter Technical brewer of Steps: 1 Entrance Stairs-Rails: None Home Layout: One level               Home Equipment: None   Additional Comments: Dtr and hired attendant will assist      Prior Functioning/Environment Level of Independence: Independent             OT Diagnosis:     OT Problem List:     OT Treatment/Interventions: Self-care/ADL training;DME and/or AE instruction    OT Goals(Current goals can be found in the care plan section) Acute Rehab OT Goals Patient Stated Goal: Resume previous lifestyle with decreased pain  OT Frequency: Min 2X/week              End of Session Nurse Communication: Mobility status  Activity Tolerance: Patient tolerated treatment well Patient left: in chair   Time: 1660-6301 OT Time Calculation (min): 20 min Charges:    G-Codes:    Betsy Pries July 02, 2014,  10:48 AM

## 2014-06-29 DIAGNOSIS — R269 Unspecified abnormalities of gait and mobility: Secondary | ICD-10-CM | POA: Diagnosis not present

## 2014-06-29 DIAGNOSIS — Z96641 Presence of right artificial hip joint: Secondary | ICD-10-CM | POA: Diagnosis not present

## 2014-06-29 DIAGNOSIS — Z471 Aftercare following joint replacement surgery: Secondary | ICD-10-CM | POA: Diagnosis not present

## 2014-07-02 DIAGNOSIS — Z96641 Presence of right artificial hip joint: Secondary | ICD-10-CM | POA: Diagnosis not present

## 2014-07-02 DIAGNOSIS — R269 Unspecified abnormalities of gait and mobility: Secondary | ICD-10-CM | POA: Diagnosis not present

## 2014-07-02 DIAGNOSIS — Z471 Aftercare following joint replacement surgery: Secondary | ICD-10-CM | POA: Diagnosis not present

## 2014-07-04 DIAGNOSIS — R269 Unspecified abnormalities of gait and mobility: Secondary | ICD-10-CM | POA: Diagnosis not present

## 2014-07-04 DIAGNOSIS — Z96641 Presence of right artificial hip joint: Secondary | ICD-10-CM | POA: Diagnosis not present

## 2014-07-04 DIAGNOSIS — Z471 Aftercare following joint replacement surgery: Secondary | ICD-10-CM | POA: Diagnosis not present

## 2014-07-06 DIAGNOSIS — Z96641 Presence of right artificial hip joint: Secondary | ICD-10-CM | POA: Diagnosis not present

## 2014-07-06 DIAGNOSIS — R269 Unspecified abnormalities of gait and mobility: Secondary | ICD-10-CM | POA: Diagnosis not present

## 2014-07-06 DIAGNOSIS — Z471 Aftercare following joint replacement surgery: Secondary | ICD-10-CM | POA: Diagnosis not present

## 2014-07-09 DIAGNOSIS — Z471 Aftercare following joint replacement surgery: Secondary | ICD-10-CM | POA: Diagnosis not present

## 2014-07-09 DIAGNOSIS — R269 Unspecified abnormalities of gait and mobility: Secondary | ICD-10-CM | POA: Diagnosis not present

## 2014-07-09 DIAGNOSIS — Z96641 Presence of right artificial hip joint: Secondary | ICD-10-CM | POA: Diagnosis not present

## 2014-07-10 DIAGNOSIS — Z471 Aftercare following joint replacement surgery: Secondary | ICD-10-CM | POA: Diagnosis not present

## 2014-07-10 DIAGNOSIS — R269 Unspecified abnormalities of gait and mobility: Secondary | ICD-10-CM | POA: Diagnosis not present

## 2014-07-10 DIAGNOSIS — Z96641 Presence of right artificial hip joint: Secondary | ICD-10-CM | POA: Diagnosis not present

## 2014-07-11 DIAGNOSIS — Z471 Aftercare following joint replacement surgery: Secondary | ICD-10-CM | POA: Diagnosis not present

## 2014-07-11 DIAGNOSIS — Z96641 Presence of right artificial hip joint: Secondary | ICD-10-CM | POA: Diagnosis not present

## 2014-07-12 DIAGNOSIS — Z471 Aftercare following joint replacement surgery: Secondary | ICD-10-CM | POA: Diagnosis not present

## 2014-07-12 DIAGNOSIS — Z96641 Presence of right artificial hip joint: Secondary | ICD-10-CM | POA: Diagnosis not present

## 2014-07-12 DIAGNOSIS — R269 Unspecified abnormalities of gait and mobility: Secondary | ICD-10-CM | POA: Diagnosis not present

## 2014-08-09 DIAGNOSIS — E559 Vitamin D deficiency, unspecified: Secondary | ICD-10-CM | POA: Diagnosis not present

## 2014-08-09 DIAGNOSIS — E780 Pure hypercholesterolemia: Secondary | ICD-10-CM | POA: Diagnosis not present

## 2014-08-09 DIAGNOSIS — Z0000 Encounter for general adult medical examination without abnormal findings: Secondary | ICD-10-CM | POA: Diagnosis not present

## 2014-08-10 DIAGNOSIS — Z471 Aftercare following joint replacement surgery: Secondary | ICD-10-CM | POA: Diagnosis not present

## 2014-08-10 DIAGNOSIS — Z96641 Presence of right artificial hip joint: Secondary | ICD-10-CM | POA: Diagnosis not present

## 2014-08-15 DIAGNOSIS — Z23 Encounter for immunization: Secondary | ICD-10-CM | POA: Diagnosis not present

## 2014-08-15 DIAGNOSIS — Z0001 Encounter for general adult medical examination with abnormal findings: Secondary | ICD-10-CM | POA: Diagnosis not present

## 2014-08-15 DIAGNOSIS — F5101 Primary insomnia: Secondary | ICD-10-CM | POA: Diagnosis not present

## 2014-09-13 DIAGNOSIS — Z96641 Presence of right artificial hip joint: Secondary | ICD-10-CM | POA: Diagnosis not present

## 2014-09-13 DIAGNOSIS — Z471 Aftercare following joint replacement surgery: Secondary | ICD-10-CM | POA: Diagnosis not present

## 2014-09-28 ENCOUNTER — Other Ambulatory Visit: Payer: Self-pay

## 2014-09-28 DIAGNOSIS — Z1231 Encounter for screening mammogram for malignant neoplasm of breast: Secondary | ICD-10-CM

## 2014-10-26 DIAGNOSIS — M791 Myalgia: Secondary | ICD-10-CM | POA: Diagnosis not present

## 2014-10-26 DIAGNOSIS — M542 Cervicalgia: Secondary | ICD-10-CM | POA: Diagnosis not present

## 2014-11-15 DIAGNOSIS — F419 Anxiety disorder, unspecified: Secondary | ICD-10-CM | POA: Diagnosis not present

## 2014-11-15 DIAGNOSIS — E559 Vitamin D deficiency, unspecified: Secondary | ICD-10-CM | POA: Diagnosis not present

## 2014-11-15 DIAGNOSIS — E785 Hyperlipidemia, unspecified: Secondary | ICD-10-CM | POA: Diagnosis not present

## 2014-11-15 DIAGNOSIS — K219 Gastro-esophageal reflux disease without esophagitis: Secondary | ICD-10-CM | POA: Diagnosis not present

## 2014-11-21 ENCOUNTER — Ambulatory Visit
Admission: RE | Admit: 2014-11-21 | Discharge: 2014-11-21 | Disposition: A | Discharge status: Home / Self Care | Payer: Medicare Other | Source: Ambulatory Visit

## 2014-11-21 DIAGNOSIS — Z1231 Encounter for screening mammogram for malignant neoplasm of breast: Secondary | ICD-10-CM

## 2015-01-04 DIAGNOSIS — D1801 Hemangioma of skin and subcutaneous tissue: Secondary | ICD-10-CM | POA: Diagnosis not present

## 2015-01-04 DIAGNOSIS — L720 Epidermal cyst: Secondary | ICD-10-CM | POA: Diagnosis not present

## 2015-01-04 DIAGNOSIS — Z85828 Personal history of other malignant neoplasm of skin: Secondary | ICD-10-CM | POA: Diagnosis not present

## 2015-01-04 DIAGNOSIS — D2261 Melanocytic nevi of right upper limb, including shoulder: Secondary | ICD-10-CM | POA: Diagnosis not present

## 2015-01-04 DIAGNOSIS — D2272 Melanocytic nevi of left lower limb, including hip: Secondary | ICD-10-CM | POA: Diagnosis not present

## 2015-01-04 DIAGNOSIS — L821 Other seborrheic keratosis: Secondary | ICD-10-CM | POA: Diagnosis not present

## 2015-01-04 DIAGNOSIS — L812 Freckles: Secondary | ICD-10-CM | POA: Diagnosis not present

## 2015-01-04 DIAGNOSIS — L820 Inflamed seborrheic keratosis: Secondary | ICD-10-CM | POA: Diagnosis not present

## 2015-01-04 DIAGNOSIS — L814 Other melanin hyperpigmentation: Secondary | ICD-10-CM | POA: Diagnosis not present

## 2015-01-14 DIAGNOSIS — H26493 Other secondary cataract, bilateral: Secondary | ICD-10-CM | POA: Diagnosis not present

## 2015-01-14 DIAGNOSIS — H04123 Dry eye syndrome of bilateral lacrimal glands: Secondary | ICD-10-CM | POA: Diagnosis not present

## 2015-02-07 DIAGNOSIS — Z124 Encounter for screening for malignant neoplasm of cervix: Secondary | ICD-10-CM | POA: Diagnosis not present

## 2015-02-07 DIAGNOSIS — Z1231 Encounter for screening mammogram for malignant neoplasm of breast: Secondary | ICD-10-CM | POA: Diagnosis not present

## 2015-02-07 DIAGNOSIS — Z6825 Body mass index (BMI) 25.0-25.9, adult: Secondary | ICD-10-CM | POA: Diagnosis not present

## 2015-03-26 DIAGNOSIS — Z23 Encounter for immunization: Secondary | ICD-10-CM | POA: Diagnosis not present

## 2015-04-22 DIAGNOSIS — M542 Cervicalgia: Secondary | ICD-10-CM | POA: Diagnosis not present

## 2015-04-22 DIAGNOSIS — M791 Myalgia: Secondary | ICD-10-CM | POA: Diagnosis not present

## 2015-04-22 DIAGNOSIS — M25561 Pain in right knee: Secondary | ICD-10-CM | POA: Diagnosis not present

## 2015-04-22 DIAGNOSIS — M50322 Other cervical disc degeneration at C5-C6 level: Secondary | ICD-10-CM | POA: Diagnosis not present

## 2015-04-22 DIAGNOSIS — M1711 Unilateral primary osteoarthritis, right knee: Secondary | ICD-10-CM | POA: Diagnosis not present

## 2015-05-27 ENCOUNTER — Ambulatory Visit (INDEPENDENT_AMBULATORY_CARE_PROVIDER_SITE_OTHER): Payer: Medicare Other | Admitting: Ophthalmology

## 2015-05-27 DIAGNOSIS — H43813 Vitreous degeneration, bilateral: Secondary | ICD-10-CM

## 2015-05-27 DIAGNOSIS — H353111 Nonexudative age-related macular degeneration, right eye, early dry stage: Secondary | ICD-10-CM | POA: Diagnosis not present

## 2015-05-27 DIAGNOSIS — H33302 Unspecified retinal break, left eye: Secondary | ICD-10-CM | POA: Diagnosis not present

## 2015-06-04 IMAGING — CR DG HIP W/ PELVIS BILAT
5 series · 5 of 5 positions shown · non-contrast
Comparison: None.

CLINICAL DATA: Bilateral hip pain right greater than left

EXAM:
BILATERAL HIP WITH PELVIS - 4+ VIEW

[t pelvis a.p.]
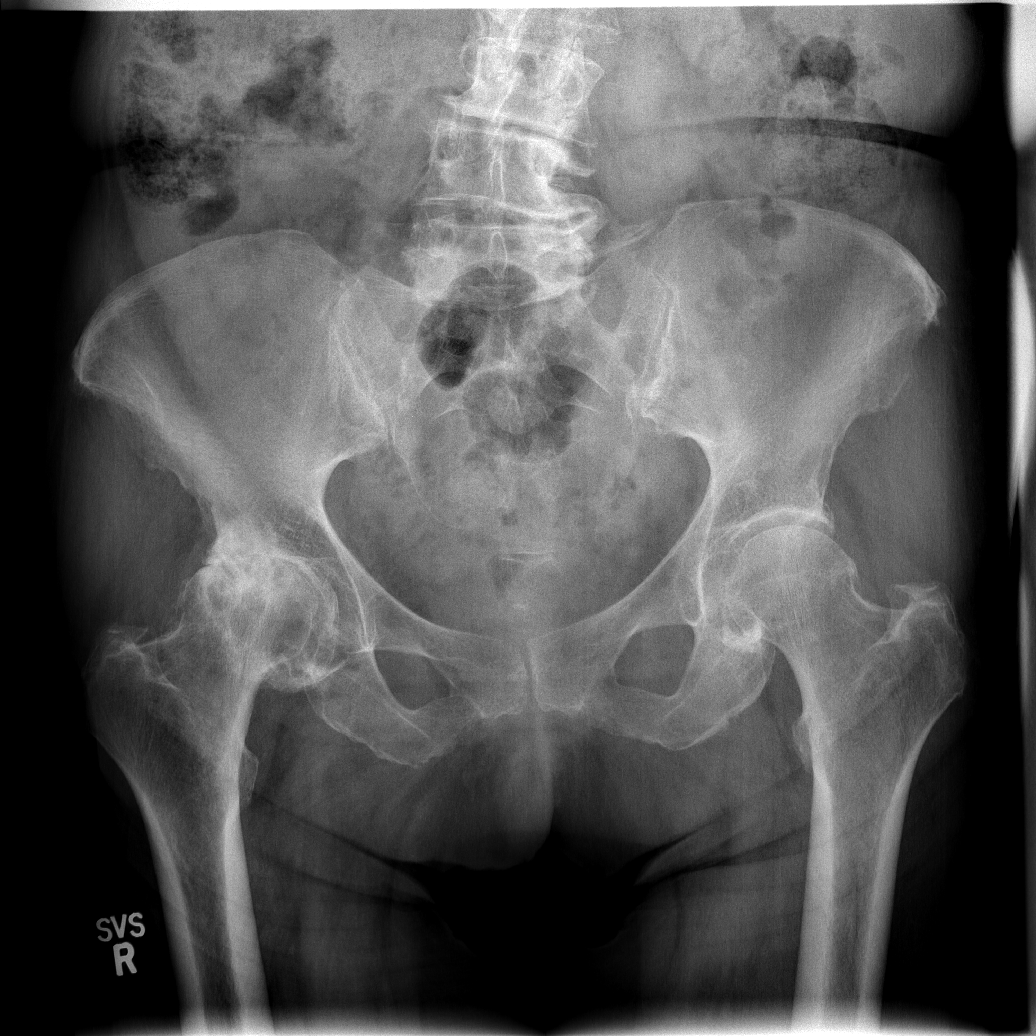

[t hip ap left]
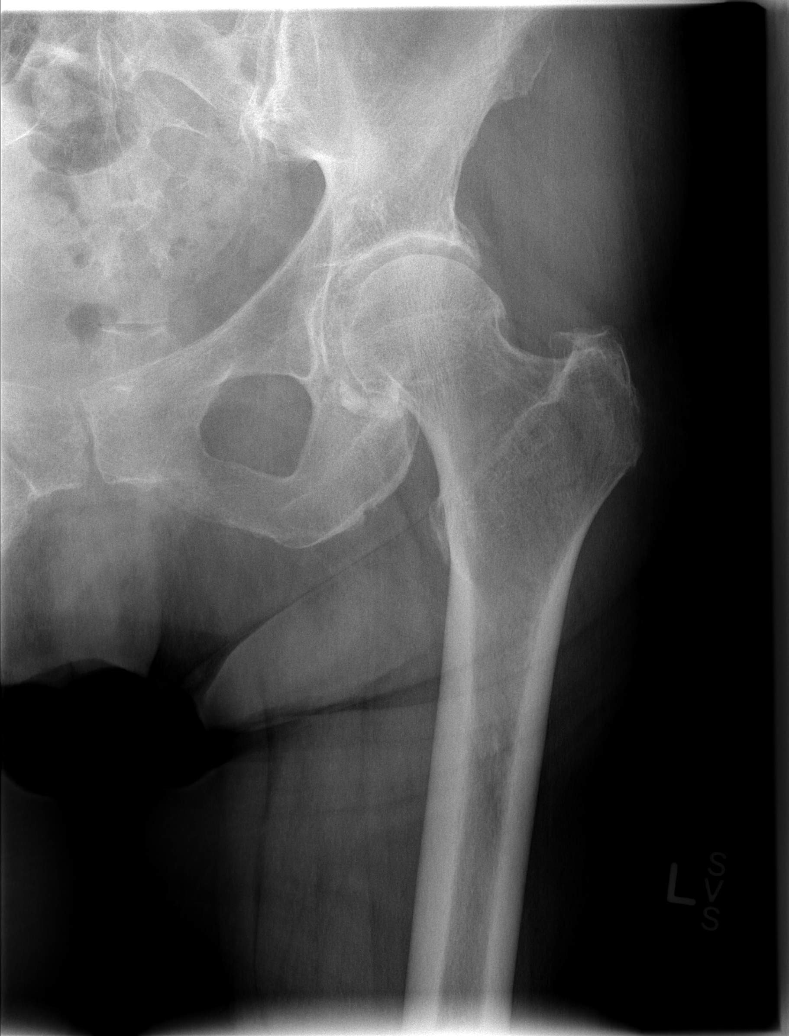

[t hip ap right]
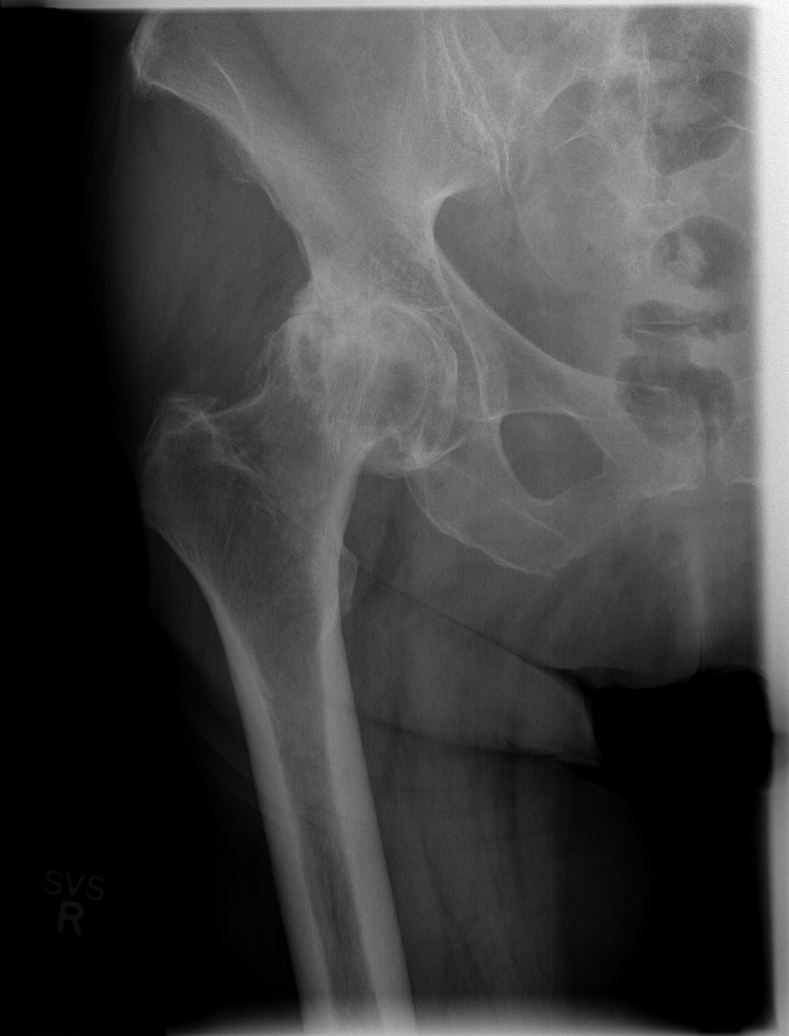

[t hip frog leg left]
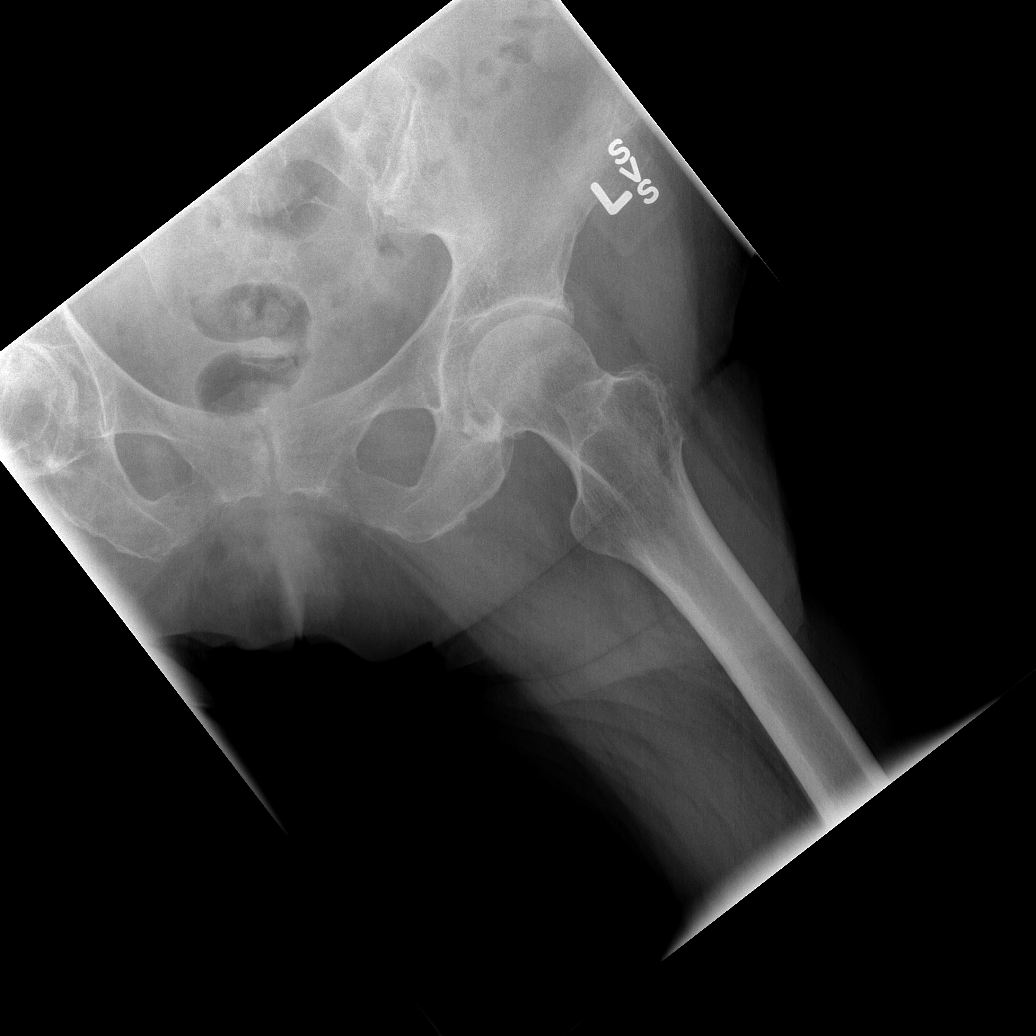

[t hip frog leg right]
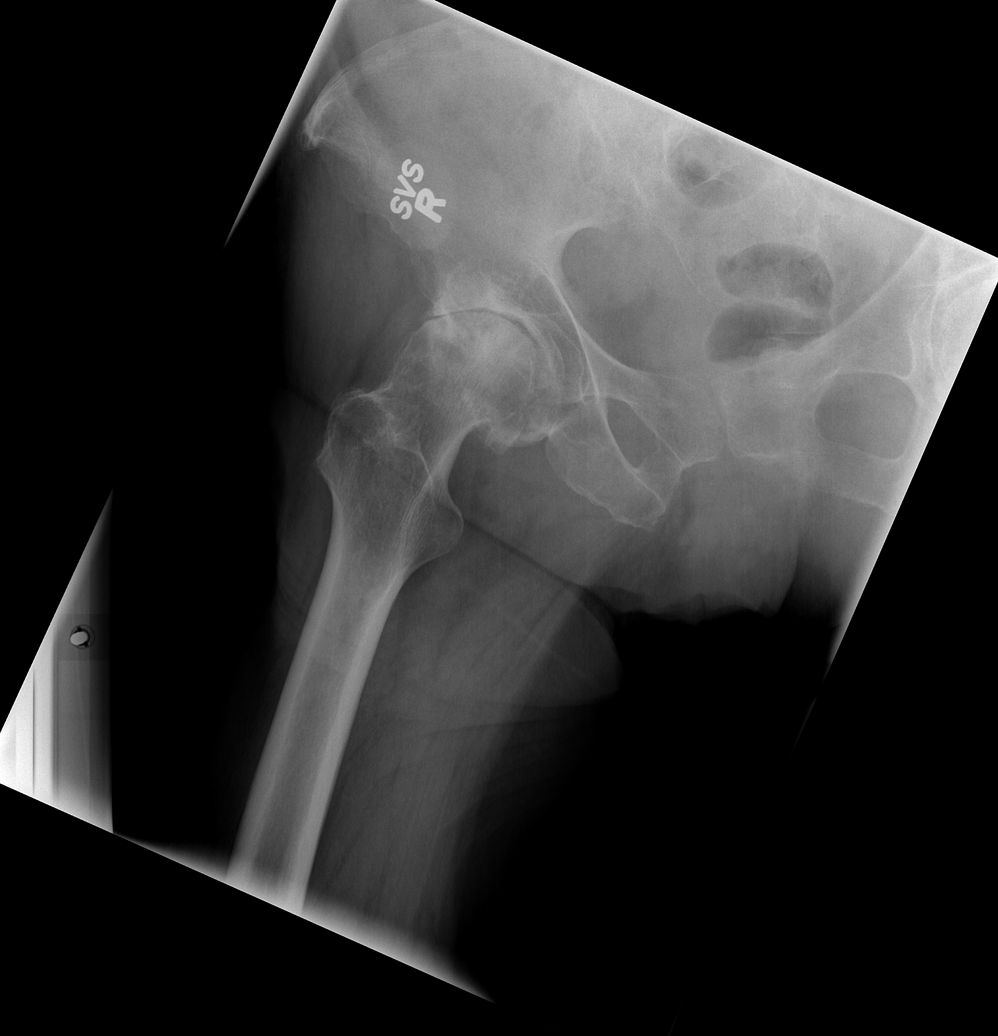

[5 of 5 positions shown; findings below may reference images not displayed]

FINDINGS: Five views bilateral hip submitted. No acute fracture or
subluxation. Significant osteoarthritic changes are noted right hip
joint. There is narrowing of superior joint space. Cystic and
sclerotic changes are noted right superior acetabulum and superior
aspect of the right femoral head. Degenerative changes are noted
lower lumbar spine. Left hip joint is preserved.
IMPRESSION: No acute fracture or subluxation. Significant osteoarthritic changes
right hip joint. Degenerative changes lumbar spine.

## 2015-08-02 DIAGNOSIS — J069 Acute upper respiratory infection, unspecified: Secondary | ICD-10-CM | POA: Diagnosis not present

## 2015-08-02 DIAGNOSIS — R05 Cough: Secondary | ICD-10-CM | POA: Diagnosis not present

## 2015-08-02 DIAGNOSIS — Z96641 Presence of right artificial hip joint: Secondary | ICD-10-CM | POA: Diagnosis not present

## 2015-08-02 DIAGNOSIS — Z471 Aftercare following joint replacement surgery: Secondary | ICD-10-CM | POA: Diagnosis not present

## 2015-08-02 DIAGNOSIS — J019 Acute sinusitis, unspecified: Secondary | ICD-10-CM | POA: Diagnosis not present

## 2015-08-02 DIAGNOSIS — G4700 Insomnia, unspecified: Secondary | ICD-10-CM | POA: Diagnosis not present

## 2015-08-12 DIAGNOSIS — E785 Hyperlipidemia, unspecified: Secondary | ICD-10-CM | POA: Diagnosis not present

## 2015-08-12 DIAGNOSIS — K219 Gastro-esophageal reflux disease without esophagitis: Secondary | ICD-10-CM | POA: Diagnosis not present

## 2015-08-12 DIAGNOSIS — Z0001 Encounter for general adult medical examination with abnormal findings: Secondary | ICD-10-CM | POA: Diagnosis not present

## 2015-08-12 DIAGNOSIS — E559 Vitamin D deficiency, unspecified: Secondary | ICD-10-CM | POA: Diagnosis not present

## 2015-08-19 DIAGNOSIS — M25561 Pain in right knee: Secondary | ICD-10-CM | POA: Diagnosis not present

## 2015-08-19 DIAGNOSIS — K219 Gastro-esophageal reflux disease without esophagitis: Secondary | ICD-10-CM | POA: Diagnosis not present

## 2015-08-19 DIAGNOSIS — M179 Osteoarthritis of knee, unspecified: Secondary | ICD-10-CM | POA: Diagnosis not present

## 2015-08-19 DIAGNOSIS — M4806 Spinal stenosis, lumbar region: Secondary | ICD-10-CM | POA: Diagnosis not present

## 2015-08-19 DIAGNOSIS — E785 Hyperlipidemia, unspecified: Secondary | ICD-10-CM | POA: Diagnosis not present

## 2015-08-19 DIAGNOSIS — Z0001 Encounter for general adult medical examination with abnormal findings: Secondary | ICD-10-CM | POA: Diagnosis not present

## 2015-08-19 DIAGNOSIS — M1990 Unspecified osteoarthritis, unspecified site: Secondary | ICD-10-CM | POA: Diagnosis not present

## 2015-08-19 DIAGNOSIS — F419 Anxiety disorder, unspecified: Secondary | ICD-10-CM | POA: Diagnosis not present

## 2015-08-19 DIAGNOSIS — E559 Vitamin D deficiency, unspecified: Secondary | ICD-10-CM | POA: Diagnosis not present

## 2015-09-06 DIAGNOSIS — M25561 Pain in right knee: Secondary | ICD-10-CM | POA: Diagnosis not present

## 2015-09-06 DIAGNOSIS — M1990 Unspecified osteoarthritis, unspecified site: Secondary | ICD-10-CM | POA: Diagnosis not present

## 2015-09-06 DIAGNOSIS — M4806 Spinal stenosis, lumbar region: Secondary | ICD-10-CM | POA: Diagnosis not present

## 2015-09-17 DIAGNOSIS — M25561 Pain in right knee: Secondary | ICD-10-CM | POA: Diagnosis not present

## 2015-09-17 DIAGNOSIS — M170 Bilateral primary osteoarthritis of knee: Secondary | ICD-10-CM | POA: Diagnosis not present

## 2015-09-17 DIAGNOSIS — M1990 Unspecified osteoarthritis, unspecified site: Secondary | ICD-10-CM | POA: Diagnosis not present

## 2015-09-17 DIAGNOSIS — M4806 Spinal stenosis, lumbar region: Secondary | ICD-10-CM | POA: Diagnosis not present

## 2015-09-24 DIAGNOSIS — M1990 Unspecified osteoarthritis, unspecified site: Secondary | ICD-10-CM | POA: Diagnosis not present

## 2015-09-24 DIAGNOSIS — M25561 Pain in right knee: Secondary | ICD-10-CM | POA: Diagnosis not present

## 2015-09-24 DIAGNOSIS — M1711 Unilateral primary osteoarthritis, right knee: Secondary | ICD-10-CM | POA: Diagnosis not present

## 2015-09-24 DIAGNOSIS — M4806 Spinal stenosis, lumbar region: Secondary | ICD-10-CM | POA: Diagnosis not present

## 2015-10-01 DIAGNOSIS — M4806 Spinal stenosis, lumbar region: Secondary | ICD-10-CM | POA: Diagnosis not present

## 2015-10-01 DIAGNOSIS — M25561 Pain in right knee: Secondary | ICD-10-CM | POA: Diagnosis not present

## 2015-10-01 DIAGNOSIS — R42 Dizziness and giddiness: Secondary | ICD-10-CM | POA: Diagnosis not present

## 2015-10-01 DIAGNOSIS — M1990 Unspecified osteoarthritis, unspecified site: Secondary | ICD-10-CM | POA: Diagnosis not present

## 2015-10-14 ENCOUNTER — Other Ambulatory Visit: Payer: Self-pay

## 2015-10-14 DIAGNOSIS — Z1231 Encounter for screening mammogram for malignant neoplasm of breast: Secondary | ICD-10-CM

## 2015-11-22 ENCOUNTER — Ambulatory Visit
Admission: RE | Admit: 2015-11-22 | Discharge: 2015-11-22 | Disposition: A | Discharge status: Home / Self Care | Payer: Medicare Other | Source: Ambulatory Visit

## 2015-11-22 DIAGNOSIS — Z1231 Encounter for screening mammogram for malignant neoplasm of breast: Secondary | ICD-10-CM | POA: Diagnosis not present

## 2015-11-23 ENCOUNTER — Emergency Department (HOSPITAL_COMMUNITY)
Admission: EM | Admit: 2015-11-23 | Discharge: 2015-11-23 | Disposition: A | Discharge status: Home / Self Care | Payer: Medicare Other | Attending: Emergency Medicine | Admitting: Emergency Medicine

## 2015-11-23 ENCOUNTER — Encounter (HOSPITAL_COMMUNITY): Payer: Self-pay | Admitting: Emergency Medicine

## 2015-11-23 ENCOUNTER — Emergency Department (HOSPITAL_COMMUNITY): Payer: Medicare Other

## 2015-11-23 DIAGNOSIS — G629 Polyneuropathy, unspecified: Secondary | ICD-10-CM | POA: Insufficient documentation

## 2015-11-23 DIAGNOSIS — Z7982 Long term (current) use of aspirin: Secondary | ICD-10-CM | POA: Insufficient documentation

## 2015-11-23 DIAGNOSIS — Z79899 Other long term (current) drug therapy: Secondary | ICD-10-CM | POA: Insufficient documentation

## 2015-11-23 DIAGNOSIS — Z96641 Presence of right artificial hip joint: Secondary | ICD-10-CM | POA: Diagnosis not present

## 2015-11-23 DIAGNOSIS — M792 Neuralgia and neuritis, unspecified: Secondary | ICD-10-CM | POA: Diagnosis not present

## 2015-11-23 DIAGNOSIS — M25551 Pain in right hip: Secondary | ICD-10-CM | POA: Diagnosis present

## 2015-11-23 DIAGNOSIS — M25851 Other specified joint disorders, right hip: Secondary | ICD-10-CM | POA: Diagnosis not present

## 2015-11-23 MED ORDER — DIAZEPAM 2 MG PO TABS
2.0000 mg | ORAL_TABLET | Freq: Once | ORAL | Status: AC
  Administered 2015-11-23: 2 mg via ORAL
  Filled 2015-11-23: qty 1

## 2015-11-23 MED ORDER — IBUPROFEN 200 MG PO TABS
600.0000 mg | ORAL_TABLET | Freq: Once | ORAL | Status: AC
  Administered 2015-11-23: 600 mg via ORAL
  Filled 2015-11-23: qty 3

## 2015-11-23 MED ORDER — IBUPROFEN 400 MG PO TABS: 400.0000 mg | ORAL_TABLET | Freq: Three times a day (TID) | ORAL | Status: DC

## 2015-11-23 MED ORDER — DIAZEPAM 2 MG PO TABS: 2.0000 mg | ORAL_TABLET | Freq: Once | ORAL | Status: DC

## 2015-11-23 MED ORDER — PREDNISONE 10 MG PO TABS: 60.0000 mg | ORAL_TABLET | Freq: Every day | ORAL | Status: DC

## 2015-11-23 MED ORDER — HYDROCODONE-ACETAMINOPHEN 5-325 MG PO TABS
1.0000 | ORAL_TABLET | Freq: Once | ORAL | Status: AC
  Administered 2015-11-23: 1 via ORAL
  Filled 2015-11-23: qty 1

## 2015-11-23 MED ORDER — HYDROCODONE-ACETAMINOPHEN 5-325 MG PO TABS: 1.0000 | ORAL_TABLET | Freq: Once | ORAL | Status: DC

## 2015-11-23 NOTE — ED Provider Notes (Signed)
CSN: [id number]     Arrival date & time 11/23/15  [id number] History   First MD Initiated Contact with Patient 11/23/15 [phone]     Chief Complaint  Patient presents with  . Leg Pain     (Consider location/radiation/quality/duration/timing/severity/associated sxs/prior Treatment) HPI Comments: Pt comes in with cc of R hip pain. Pt reports that she started having some tingling in her R leg 2 weeks ago. More recently, she has been having severe pain in her R anterior hip that is radiating to her knee, and occasionally to her ankle. Pt denies trauma. No associated weakness, urinary incontinence, urinary retention, bowel incontinence, saddle anesthesia. She has had occasional back pain - but denies any current back pain. She also denies any trauma recently. Pt has hx of R hip arthroplasty.   ROS 10 Systems reviewed and are negative for acute change except as noted in the HPI.       Patient is a 82 y.o. female presenting with leg pain. The history is provided by the patient.  Leg Pain   Past Medical History  Diagnosis Date  . GERD (gastroesophageal reflux disease)     hx of   . Arthritis    Past Surgical History  Procedure Laterality Date  . Abdominal hysterectomy    . Total hip arthroplasty Right 06/26/2014    Procedure: RIGHT TOTAL HIP ARTHROPLASTY ANTERIOR APPROACH;  Surgeon: [name], MD;  Location: [facility];  Service: Orthopedics;  Laterality: Right;   History reviewed. No pertinent family history. Social History  Substance Use Topics  . Smoking status: Never Smoker   . Smokeless tobacco: Never Used  . Alcohol Use: Yes     Comment: 1/4 cup of red wine daily    OB History    No data available     Review of Systems    Allergies  Review of patient's allergies indicates no known allergies.  Home Medications   Prior to Admission medications   Medication Sig Start Date End Date Taking? Authorizing Provider  aspirin EC 325 MG EC tablet Take 1 tablet (325 mg total) by  mouth 2 (two) times daily. 06/27/14   Danae Orleans, PA-C  b complex vitamins tablet Take 1 tablet by mouth every morning.    Historical Provider, MD  beta carotene w/minerals (OCUVITE) tablet Take 1 tablet by mouth every morning.    Historical Provider, MD  CALCIUM CITRATE PO Take 1 tablet by mouth 2 (two) times daily.    Historical Provider, MD  Cholecalciferol (VITAMIN D3) 2000 UNITS TABS Take 1 tablet by mouth every morning.    Historical Provider, MD  diazepam (VALIUM) 2 MG tablet Take 1 tablet (2 mg total) by mouth once. 11/23/15   Varney Biles, MD  docusate sodium 100 MG CAPS Take 100 mg by mouth 2 (two) times daily. 06/27/14   Danae Orleans, PA-C  ferrous sulfate 325 (65 FE) MG tablet Take 1 tablet (325 mg total) by mouth 3 (three) times daily after meals. 06/27/14   Danae Orleans, PA-C  Ginger, Zingiber officinalis, (GINGER ROOT) 550 MG CAPS Take 1 capsule by mouth every morning.    Historical Provider, MD  HYDROcodone-acetaminophen (NORCO/VICODIN) 5-325 MG tablet Take 1 tablet by mouth once. 11/23/15   Varney Biles, MD  ibuprofen (ADVIL,MOTRIN) 400 MG tablet Take 1 tablet (400 mg total) by mouth 3 (three) times daily. 11/23/15   Varney Biles, MD  Multiple Vitamins-Minerals (CENTRUM SILVER ADULT 50+ PO) Take 1 tablet by mouth every morning.  Historical Provider, MD  Omega-3 Fatty Acids (FISH OIL) 1200 MG CAPS Take 1 capsule by mouth 2 (two) times daily.    Historical Provider, MD  polyethylene glycol (MIRALAX / GLYCOLAX) packet Take 17 g by mouth 2 (two) times daily. 06/27/14   Danae Orleans, PA-C  predniSONE (DELTASONE) 10 MG tablet Take 6 tablets (60 mg total) by mouth daily. 11/23/15   Varney Biles, MD  temazepam (RESTORIL) 15 MG capsule Take 15 mg by mouth at bedtime as needed for sleep.    Historical Provider, MD  tiZANidine (ZANAFLEX) 4 MG tablet Take 1 tablet (4 mg total) by mouth every 6 (six) hours as needed for muscle spasms. 06/27/14   Danae Orleans, PA-C   BP 148/63 mmHg  Pulse  88  Temp(Src) 97.5 F (36.4 C) (Oral)  Resp 18  Ht 5\' 5"  (1.651 m)  Wt 142 lb (64.411 kg)  BMI 23.63 kg/m2  SpO2 98% Physical Exam  Constitutional: She is oriented to person, place, and time. She appears well-developed.  HENT:  Head: Normocephalic and atraumatic.  Eyes: EOM are normal.  Neck: Normal range of motion. Neck supple. No JVD present.  Cardiovascular: Normal rate.   Pulmonary/Chest: Effort normal.  Abdominal: Bowel sounds are normal.  Musculoskeletal:  Pt has NO tenderness over the lumbar region No step offs, no erythema. Pt has 2+ patellar reflex bilaterally. Able to discriminate between sharp and dull - but she has clear subjective numbness on the lateral side of her RLE Able to ambulate   Neurological: She is alert and oriented to person, place, and time.  Skin: Skin is warm and dry.  Nursing note and vitals reviewed.   ED Course  Procedures (including critical care time) Labs Review Labs Reviewed - No data to display  Imaging Review Dg Hip Unilat With Pelvis 2-3 Views Right  11/23/2015  CLINICAL DATA:  Right hip pain extending almost to the knee beginning 2 weeks ago without a known injury. Low back pain. Prior right hip replacement. EXAM: DG HIP (WITH OR WITHOUT PELVIS) 2-3V RIGHT COMPARISON:  06/26/2014 FINDINGS: Sequelae of right total hip arthroplasty are again identified. The prosthetic components are normally located without dislocation. No acute fracture is identified. Left hip joint space width is preserved. Spondylosis and disc degeneration are partially visualized in the lower lumbar spine. No soft tissue abnormality is seen. IMPRESSION: Prior right total hip arthroplasty without acute abnormality identified. Electronically Signed   By: Logan Bores M.D.   On: 11/23/2015 11:05   I have personally reviewed and evaluated these images and lab results as part of my medical decision-making.   EKG Interpretation None      MDM   Final diagnoses:   Impingement syndrome, hip, right  Neuropathic pain    Pt with neuropathic pain -- impingement type. Also has hip pain. No back pain at the moment. No red flags to be concerned of cord compression. Pt has subjective numbness to the lateral side of the RLE.  - Likely impingement syndrome. - Advised PCP f/u - might need MRI electively. - Xrays hip to ensure hardware has not moved. - Neuro f/u if pcp unable to help or MRI is neg for possible nerve conduction studies.    Varney Biles, MD 11/23/15 1205

## 2015-11-23 NOTE — Discharge Instructions (Signed)
Please utilize ICE and HEAT therapy in conjunction with the medicine we are prescribing. Please be careful with the medicine - they are sedative and puts you at risk for falls.  See your primary care doctor this week. If they feel they can help you, that would be the best pathway at diagnosis. If they are not sure, then call the Neurologist.   Heat Therapy Heat therapy can help ease sore, stiff, injured, and tight muscles and joints. Heat relaxes your muscles, which may help ease your pain.  RISKS AND COMPLICATIONS If you have any of the following conditions, do not use heat therapy unless your health care provider has approved:  Poor circulation.  Healing wounds or scarred skin in the area being treated.  Diabetes, heart disease, or high blood pressure.  Not being able to feel (numbness) the area being treated.  Unusual swelling of the area being treated.  Active infections.  Blood clots.  Cancer.  Inability to communicate pain. This may include young children and people who have problems with their brain function (dementia).  Pregnancy. Heat therapy should only be used on old, pre-existing, or long-lasting (chronic) injuries. Do not use heat therapy on new injuries unless directed by your health care provider. HOW TO USE HEAT THERAPY There are several different kinds of heat therapy, including:  Moist heat pack.  Warm water bath.  Hot water bottle.  Electric heating pad.  Heated gel pack.  Heated wrap.  Electric heating pad. Use the heat therapy method suggested by your health care provider. Follow your health care provider's instructions on when and how to use heat therapy. GENERAL HEAT THERAPY RECOMMENDATIONS  Do not sleep while using heat therapy. Only use heat therapy while you are awake.  Your skin may turn pink while using heat therapy. Do not use heat therapy if your skin turns red.  Do not use heat therapy if you have new pain.  High heat or long  exposure to heat can cause burns. Be careful when using heat therapy to avoid burning your skin.  Do not use heat therapy on areas of your skin that are already irritated, such as with a rash or sunburn. SEEK MEDICAL CARE IF:  You have blisters, redness, swelling, or numbness.  You have new pain.  Your pain is worse. MAKE SURE YOU:  Understand these instructions.  Will watch your condition.  Will get help right away if you are not doing well or get worse.   This information is not intended to replace advice given to you by your health care provider. Make sure you discuss any questions you have with your health care provider.   Document Released: 08/31/2011 Document Revised: 06/29/2014 Document Reviewed: 08/01/2013 Elsevier Interactive Patient Education 2016 [employer]  Neuropathic Pain Neuropathic pain is pain caused by damage to the nerves that are responsible for certain sensations in your body (sensory nerves). The pain can be caused by damage to:   The sensory nerves that send signals to your spinal cord and brain (peripheral nervous system).  The sensory nerves in your brain or spinal cord (central nervous system). Neuropathic pain can make you more sensitive to pain. What would be a minor sensation for most people may feel very painful if you have neuropathic pain. This is usually a long-term condition that can be difficult to treat. The type of pain can differ from person to person. It may start suddenly (acute), or it may develop slowly and last for a long time (  chronic). Neuropathic pain may come and go as damaged nerves heal or may stay at the same level for years. It often causes emotional distress, loss of sleep, and a lower quality of life. CAUSES  The most common cause of damage to a sensory nerve is diabetes. Many other diseases and conditions can also cause neuropathic pain. Causes of neuropathic pain can be classified as:  Toxic. Many drugs and chemicals can  cause toxic damage. The most common cause of toxic neuropathic pain is damage from drug treatment for cancer (chemotherapy).  Metabolic. This type of pain can happen when a disease causes imbalances that damage nerves. Diabetes is the most common of these diseases. Vitamin B deficiency caused by long-term alcohol abuse is another common cause.  Traumatic. Any injury that cuts, crushes, or stretches a nerve can cause damage and pain. A common example is feeling pain after losing an arm or leg (phantom limb pain).  Compression-related. If a sensory nerve gets trapped or compressed for a long period of time, the blood supply to the nerve can be cut off.  Vascular. Many blood vessel diseases can cause neuropathic pain by decreasing blood supply and oxygen to nerves.  Autoimmune. This type of pain results from diseases in which the body's defense system mistakenly attacks sensory nerves. Examples of autoimmune diseases that can cause neuropathic pain include lupus and multiple sclerosis.  Infectious. Many types of viral infections can damage sensory nerves and cause pain. Shingles infection is a common cause of this type of pain.  Inherited. Neuropathic pain can be a symptom of many diseases that are passed down through families (genetic). SIGNS AND SYMPTOMS  The main symptom is pain. Neuropathic pain is often described as:  Burning.  Shock-like.  Stinging.  Hot or cold.  Itching. DIAGNOSIS  No single test can diagnose neuropathic pain. Your health care provider will do a physical exam and ask you about your pain. You may use a pain scale to describe how bad your pain is. You may also have tests to see if you have a high sensitivity to pain and to help find the cause and location of any sensory nerve damage. These tests may include:  Imaging studies, such as:  X-rays.  CT scan.  MRI.  Nerve conduction studies to test how well nerve signals travel through your sensory nerves  (electrodiagnostic testing).  Stimulating your sensory nerves through electrodes on your skin and measuring the response in your spinal cord and brain (somatosensory evoked potentials). TREATMENT  Treatment for neuropathic pain may change over time. You may need to try different treatment options or a combination of treatments. Some options include:  Over-the-counter pain relievers.  Prescription medicines. Some medicines used to treat other conditions may also help neuropathic pain. These include medicines to:  Control seizures (anticonvulsants).  Relieve depression (antidepressants).  Prescription-strength pain relievers (narcotics). These are usually used when other pain relievers do not help.  Transcutaneous nerve stimulation (TENS). This uses electrical currents to block painful nerve signals. The treatment is painless.  Topical and local anesthetics. These are medicines that numb the nerves. They can be injected as a nerve block or applied to the skin.  Alternative treatments, such as:  Acupuncture.  Meditation.  Massage.  Physical therapy.  Pain management programs.  Counseling. HOME CARE INSTRUCTIONS  Learn as much as you can about your condition.  Take medicines only as directed by your health care provider.  Work closely with all your health care providers to find  what works best for you.  Have a good support system at home.  Consider joining a chronic pain support group. SEEK MEDICAL CARE IF:  Your pain treatments are not helping.  You are having side effects from your medicines.  You are struggling with fatigue, mood changes, depression, or anxiety.   This information is not intended to replace advice given to you by your health care provider. Make sure you discuss any questions you have with your health care provider.   Document Released: 03/05/2004 Document Revised: 06/29/2014 Document Reviewed: 11/16/2013 Elsevier Interactive Patient Education  2016 [employer] Radicular Pain Radicular pain in either the arm or leg is usually from a bulging or herniated disk in the spine. A piece of the herniated disk may press against the nerves as the nerves exit the spine. This causes pain which is felt at the tips of the nerves down the arm or leg. Other causes of radicular pain may include:  Fractures.  Heart disease.  Cancer.  An abnormal and usually degenerative state of the nervous system or nerves (neuropathy). Diagnosis may require CT or MRI scanning to determine the primary cause.  Nerves that start at the neck (nerve roots) may cause radicular pain in the outer shoulder and arm. It can spread down to the thumb and fingers. The symptoms vary depending on which nerve root has been affected. In most cases radicular pain improves with conservative treatment. Neck problems may require physical therapy, a neck collar, or cervical traction. Treatment may take many weeks, and surgery may be considered if the symptoms do not improve.  Conservative treatment is also recommended for sciatica. Sciatica causes pain to radiate from the lower back or buttock area down the leg into the foot. Often there is a history of back problems. Most patients with sciatica are better after 2 to 4 weeks of rest and other supportive care. Short term bed rest can reduce the disk pressure considerably. Sitting, however, is not a good position since this increases the pressure on the disk. You should avoid bending, lifting, and all other activities which make the problem worse. Traction can be used in severe cases. Surgery is usually reserved for patients who do not improve within the first months of treatment. Only take over-the-counter or prescription medicines for pain, discomfort, or fever as directed by your caregiver. Narcotics and muscle relaxants may help by relieving more severe pain and spasm and by providing mild sedation. Cold or massage can give significant  relief. Spinal manipulation is not recommended. It can increase the degree of disc protrusion. Epidural steroid injections are often effective treatment for radicular pain. These injections deliver medicine to the spinal nerve in the space between the protective covering of the spinal cord and back bones (vertebrae). Your caregiver can give you more information about steroid injections. These injections are most effective when given within two weeks of the onset of pain.  You should see your caregiver for follow up care as recommended. A program for neck and back injury rehabilitation with stretching and strengthening exercises is an important part of management.  SEEK IMMEDIATE MEDICAL CARE IF:  You develop increased pain, weakness, or numbness in your arm or leg.  You develop difficulty with bladder or bowel control.  You develop abdominal pain.   This information is not intended to replace advice given to you by your health care provider. Make sure you discuss any questions you have with your health care provider.   Document Released: 07/16/2004 Document  Revised: 06/29/2014 Document Reviewed: 01/02/2015 Elsevier Interactive Patient Education 2016 [employer]. Cryotherapy Cryotherapy means treatment with cold. Ice or gel packs can be used to reduce both pain and swelling. Ice is the most helpful within the first 24 to 48 hours after an injury or flare-up from overusing a muscle or joint. Sprains, strains, spasms, burning pain, shooting pain, and aches can all be eased with ice. Ice can also be used when recovering from surgery. Ice is effective, has very few side effects, and is safe for most people to use. PRECAUTIONS  Ice is not a safe treatment option for people with:  Raynaud phenomenon. This is a condition affecting small blood vessels in the extremities. Exposure to cold may cause your problems to return.  Cold hypersensitivity. There are many forms of cold hypersensitivity,  including:  Cold urticaria. Red, itchy hives appear on the skin when the tissues begin to warm after being iced.  Cold erythema. This is a red, itchy rash caused by exposure to cold.  Cold hemoglobinuria. Red blood cells break down when the tissues begin to warm after being iced. The hemoglobin that carry oxygen are passed into the urine because they cannot combine with blood proteins fast enough.  Numbness or altered sensitivity in the area being iced. If you have any of the following conditions, do not use ice until you have discussed cryotherapy with your caregiver:  Heart conditions, such as arrhythmia, angina, or chronic heart disease.  High blood pressure.  Healing wounds or open skin in the area being iced.  Current infections.  Rheumatoid arthritis.  Poor circulation.  Diabetes. Ice slows the blood flow in the region it is applied. This is beneficial when trying to stop inflamed tissues from spreading irritating chemicals to surrounding tissues. However, if you expose your skin to cold temperatures for too long or without the proper protection, you can damage your skin or nerves. Watch for signs of skin damage due to cold. HOME CARE INSTRUCTIONS Follow these tips to use ice and cold packs safely.  Place a dry or damp towel between the ice and skin. A damp towel will cool the skin more quickly, so you may need to shorten the time that the ice is used.  For a more rapid response, add gentle compression to the ice.  Ice for no more than 10 to 20 minutes at a time. The bonier the area you are icing, the less time it will take to get the benefits of ice.  Check your skin after 5 minutes to make sure there are no signs of a poor response to cold or skin damage.  Rest 20 minutes or more between uses.  Once your skin is numb, you can end your treatment. You can test numbness by very lightly touching your skin. The touch should be so light that you do not see the skin dimple from  the pressure of your fingertip. When using ice, most people will feel these normal sensations in this order: cold, burning, aching, and numbness.  Do not use ice on someone who cannot communicate their responses to pain, such as small children or people with dementia. HOW TO MAKE AN ICE PACK Ice packs are the most common way to use ice therapy. Other methods include ice massage, ice baths, and cryosprays. Muscle creams that cause a cold, tingly feeling do not offer the same benefits that ice offers and should not be used as a substitute unless recommended by your caregiver. To make an  ice pack, do one of the following:  Place crushed ice or a bag of frozen vegetables in a sealable plastic bag. Squeeze out the excess air. Place this bag inside another plastic bag. Slide the bag into a pillowcase or place a damp towel between your skin and the bag.  Mix 3 parts water with 1 part rubbing alcohol. Freeze the mixture in a sealable plastic bag. When you remove the mixture from the freezer, it will be slushy. Squeeze out the excess air. Place this bag inside another plastic bag. Slide the bag into a pillowcase or place a damp towel between your skin and the bag. SEEK MEDICAL CARE IF:  You develop white spots on your skin. This may give the skin a blotchy (mottled) appearance.  Your skin turns blue or pale.  Your skin becomes waxy or hard.  Your swelling gets worse. MAKE SURE YOU:   Understand these instructions.  Will watch your condition.  Will get help right away if you are not doing well or get worse.   This information is not intended to replace advice given to you by your health care provider. Make sure you discuss any questions you have with your health care provider.   Document Released: 02/02/2011 Document Revised: 06/29/2014 Document Reviewed: 02/02/2011 Elsevier Interactive Patient Education [employer].

## 2015-11-23 NOTE — ED Notes (Signed)
MD at bedside. 

## 2015-11-23 NOTE — ED Notes (Signed)
Pt reports pain and tingling from L hip to knee for the past 2 weeks. Also has intermittent numbness in ankle. Same side as L hip surgery in January. Pt ambulatory to room.

## 2015-11-23 NOTE — ED Notes (Signed)
Patient transported to X-ray 

## 2015-11-29 DIAGNOSIS — Z96641 Presence of right artificial hip joint: Secondary | ICD-10-CM | POA: Diagnosis not present

## 2015-11-29 DIAGNOSIS — M79604 Pain in right leg: Secondary | ICD-10-CM | POA: Diagnosis not present

## 2015-11-29 DIAGNOSIS — Z471 Aftercare following joint replacement surgery: Secondary | ICD-10-CM | POA: Diagnosis not present

## 2016-01-06 DIAGNOSIS — L812 Freckles: Secondary | ICD-10-CM | POA: Diagnosis not present

## 2016-01-06 DIAGNOSIS — D1801 Hemangioma of skin and subcutaneous tissue: Secondary | ICD-10-CM | POA: Diagnosis not present

## 2016-01-06 DIAGNOSIS — Z85828 Personal history of other malignant neoplasm of skin: Secondary | ICD-10-CM | POA: Diagnosis not present

## 2016-01-06 DIAGNOSIS — L821 Other seborrheic keratosis: Secondary | ICD-10-CM | POA: Diagnosis not present

## 2016-01-17 DIAGNOSIS — Z471 Aftercare following joint replacement surgery: Secondary | ICD-10-CM | POA: Diagnosis not present

## 2016-01-17 DIAGNOSIS — M79604 Pain in right leg: Secondary | ICD-10-CM | POA: Diagnosis not present

## 2016-01-17 DIAGNOSIS — Z96641 Presence of right artificial hip joint: Secondary | ICD-10-CM | POA: Diagnosis not present

## 2016-01-17 DIAGNOSIS — M1711 Unilateral primary osteoarthritis, right knee: Secondary | ICD-10-CM | POA: Diagnosis not present

## 2016-01-17 DIAGNOSIS — M25561 Pain in right knee: Secondary | ICD-10-CM | POA: Diagnosis not present

## 2016-01-23 DIAGNOSIS — H26493 Other secondary cataract, bilateral: Secondary | ICD-10-CM | POA: Diagnosis not present

## 2016-01-23 DIAGNOSIS — H524 Presbyopia: Secondary | ICD-10-CM | POA: Diagnosis not present

## 2016-01-23 DIAGNOSIS — H04123 Dry eye syndrome of bilateral lacrimal glands: Secondary | ICD-10-CM | POA: Diagnosis not present

## 2016-02-10 DIAGNOSIS — E559 Vitamin D deficiency, unspecified: Secondary | ICD-10-CM | POA: Diagnosis not present

## 2016-02-10 DIAGNOSIS — E785 Hyperlipidemia, unspecified: Secondary | ICD-10-CM | POA: Diagnosis not present

## 2016-02-17 DIAGNOSIS — G4700 Insomnia, unspecified: Secondary | ICD-10-CM | POA: Diagnosis not present

## 2016-02-17 DIAGNOSIS — Z23 Encounter for immunization: Secondary | ICD-10-CM | POA: Diagnosis not present

## 2016-02-17 DIAGNOSIS — E559 Vitamin D deficiency, unspecified: Secondary | ICD-10-CM | POA: Diagnosis not present

## 2016-02-17 DIAGNOSIS — N183 Chronic kidney disease, stage 3 (moderate): Secondary | ICD-10-CM | POA: Diagnosis not present

## 2016-02-17 DIAGNOSIS — E785 Hyperlipidemia, unspecified: Secondary | ICD-10-CM | POA: Diagnosis not present

## 2016-02-25 DIAGNOSIS — M8588 Other specified disorders of bone density and structure, other site: Secondary | ICD-10-CM | POA: Diagnosis not present

## 2016-02-25 DIAGNOSIS — Z6824 Body mass index (BMI) 24.0-24.9, adult: Secondary | ICD-10-CM | POA: Diagnosis not present

## 2016-02-25 DIAGNOSIS — N958 Other specified menopausal and perimenopausal disorders: Secondary | ICD-10-CM | POA: Diagnosis not present

## 2016-02-25 DIAGNOSIS — Z01419 Encounter for gynecological examination (general) (routine) without abnormal findings: Secondary | ICD-10-CM | POA: Diagnosis not present

## 2016-04-03 DIAGNOSIS — M859 Disorder of bone density and structure, unspecified: Secondary | ICD-10-CM | POA: Diagnosis not present

## 2016-04-03 DIAGNOSIS — Z1329 Encounter for screening for other suspected endocrine disorder: Secondary | ICD-10-CM | POA: Diagnosis not present

## 2016-04-03 DIAGNOSIS — R635 Abnormal weight gain: Secondary | ICD-10-CM | POA: Diagnosis not present

## 2016-05-28 ENCOUNTER — Ambulatory Visit (INDEPENDENT_AMBULATORY_CARE_PROVIDER_SITE_OTHER): Payer: Medicare Other | Admitting: Ophthalmology

## 2016-05-28 DIAGNOSIS — H43813 Vitreous degeneration, bilateral: Secondary | ICD-10-CM

## 2016-05-28 DIAGNOSIS — H33302 Unspecified retinal break, left eye: Secondary | ICD-10-CM | POA: Diagnosis not present

## 2016-05-28 DIAGNOSIS — H353111 Nonexudative age-related macular degeneration, right eye, early dry stage: Secondary | ICD-10-CM | POA: Diagnosis not present

## 2016-06-17 DIAGNOSIS — J069 Acute upper respiratory infection, unspecified: Secondary | ICD-10-CM | POA: Diagnosis not present

## 2016-06-24 DIAGNOSIS — R05 Cough: Secondary | ICD-10-CM | POA: Diagnosis not present

## 2016-07-22 DIAGNOSIS — L820 Inflamed seborrheic keratosis: Secondary | ICD-10-CM | POA: Diagnosis not present

## 2016-07-22 DIAGNOSIS — Z85828 Personal history of other malignant neoplasm of skin: Secondary | ICD-10-CM | POA: Diagnosis not present

## 2016-08-14 DIAGNOSIS — E785 Hyperlipidemia, unspecified: Secondary | ICD-10-CM | POA: Diagnosis not present

## 2016-08-14 DIAGNOSIS — E559 Vitamin D deficiency, unspecified: Secondary | ICD-10-CM | POA: Diagnosis not present

## 2016-08-21 DIAGNOSIS — N183 Chronic kidney disease, stage 3 (moderate): Secondary | ICD-10-CM | POA: Diagnosis not present

## 2016-08-21 DIAGNOSIS — E785 Hyperlipidemia, unspecified: Secondary | ICD-10-CM | POA: Diagnosis not present

## 2016-08-21 DIAGNOSIS — E559 Vitamin D deficiency, unspecified: Secondary | ICD-10-CM | POA: Diagnosis not present

## 2016-08-21 DIAGNOSIS — K219 Gastro-esophageal reflux disease without esophagitis: Secondary | ICD-10-CM | POA: Diagnosis not present

## 2016-08-21 DIAGNOSIS — R7989 Other specified abnormal findings of blood chemistry: Secondary | ICD-10-CM | POA: Diagnosis not present

## 2016-11-12 ENCOUNTER — Other Ambulatory Visit: Payer: Self-pay | Admitting: Internal Medicine

## 2016-11-12 DIAGNOSIS — Z1231 Encounter for screening mammogram for malignant neoplasm of breast: Secondary | ICD-10-CM

## 2016-12-07 ENCOUNTER — Ambulatory Visit
Admission: RE | Admit: 2016-12-07 | Discharge: 2016-12-07 | Disposition: A | Discharge status: Home / Self Care | Payer: Medicare Other | Source: Ambulatory Visit | Attending: Internal Medicine | Admitting: Internal Medicine

## 2016-12-07 DIAGNOSIS — Z1231 Encounter for screening mammogram for malignant neoplasm of breast: Secondary | ICD-10-CM

## 2017-01-08 DIAGNOSIS — Z85828 Personal history of other malignant neoplasm of skin: Secondary | ICD-10-CM | POA: Diagnosis not present

## 2017-01-08 DIAGNOSIS — D225 Melanocytic nevi of trunk: Secondary | ICD-10-CM | POA: Diagnosis not present

## 2017-01-08 DIAGNOSIS — L820 Inflamed seborrheic keratosis: Secondary | ICD-10-CM | POA: Diagnosis not present

## 2017-01-08 DIAGNOSIS — L814 Other melanin hyperpigmentation: Secondary | ICD-10-CM | POA: Diagnosis not present

## 2017-01-08 DIAGNOSIS — L821 Other seborrheic keratosis: Secondary | ICD-10-CM | POA: Diagnosis not present

## 2017-01-08 DIAGNOSIS — D1801 Hemangioma of skin and subcutaneous tissue: Secondary | ICD-10-CM | POA: Diagnosis not present

## 2017-01-25 DIAGNOSIS — H524 Presbyopia: Secondary | ICD-10-CM | POA: Diagnosis not present

## 2017-01-25 DIAGNOSIS — H26493 Other secondary cataract, bilateral: Secondary | ICD-10-CM | POA: Diagnosis not present

## 2017-01-25 DIAGNOSIS — H04123 Dry eye syndrome of bilateral lacrimal glands: Secondary | ICD-10-CM | POA: Diagnosis not present

## 2017-02-17 DIAGNOSIS — E785 Hyperlipidemia, unspecified: Secondary | ICD-10-CM | POA: Diagnosis not present

## 2017-02-17 DIAGNOSIS — N183 Chronic kidney disease, stage 3 (moderate): Secondary | ICD-10-CM | POA: Diagnosis not present

## 2017-02-17 DIAGNOSIS — E559 Vitamin D deficiency, unspecified: Secondary | ICD-10-CM | POA: Diagnosis not present

## 2017-02-24 DIAGNOSIS — K219 Gastro-esophageal reflux disease without esophagitis: Secondary | ICD-10-CM | POA: Diagnosis not present

## 2017-02-24 DIAGNOSIS — F419 Anxiety disorder, unspecified: Secondary | ICD-10-CM | POA: Diagnosis not present

## 2017-02-24 DIAGNOSIS — E559 Vitamin D deficiency, unspecified: Secondary | ICD-10-CM | POA: Diagnosis not present

## 2017-02-24 DIAGNOSIS — E785 Hyperlipidemia, unspecified: Secondary | ICD-10-CM | POA: Diagnosis not present

## 2017-03-24 DIAGNOSIS — Z6824 Body mass index (BMI) 24.0-24.9, adult: Secondary | ICD-10-CM | POA: Diagnosis not present

## 2017-03-24 DIAGNOSIS — Z124 Encounter for screening for malignant neoplasm of cervix: Secondary | ICD-10-CM | POA: Diagnosis not present

## 2017-04-02 DIAGNOSIS — Z23 Encounter for immunization: Secondary | ICD-10-CM | POA: Diagnosis not present

## 2017-05-31 ENCOUNTER — Ambulatory Visit (INDEPENDENT_AMBULATORY_CARE_PROVIDER_SITE_OTHER): Payer: Medicare Other | Admitting: Ophthalmology

## 2017-07-12 ENCOUNTER — Encounter (INDEPENDENT_AMBULATORY_CARE_PROVIDER_SITE_OTHER): Payer: Medicare Other | Admitting: Ophthalmology

## 2017-07-12 DIAGNOSIS — H33302 Unspecified retinal break, left eye: Secondary | ICD-10-CM | POA: Diagnosis not present

## 2017-07-12 DIAGNOSIS — H353131 Nonexudative age-related macular degeneration, bilateral, early dry stage: Secondary | ICD-10-CM | POA: Diagnosis not present

## 2017-07-12 DIAGNOSIS — H43813 Vitreous degeneration, bilateral: Secondary | ICD-10-CM

## 2017-08-17 DIAGNOSIS — Z0000 Encounter for general adult medical examination without abnormal findings: Secondary | ICD-10-CM | POA: Diagnosis not present

## 2017-08-17 DIAGNOSIS — E559 Vitamin D deficiency, unspecified: Secondary | ICD-10-CM | POA: Diagnosis not present

## 2017-08-17 DIAGNOSIS — E039 Hypothyroidism, unspecified: Secondary | ICD-10-CM | POA: Diagnosis not present

## 2017-08-17 DIAGNOSIS — E785 Hyperlipidemia, unspecified: Secondary | ICD-10-CM | POA: Diagnosis not present

## 2017-08-24 DIAGNOSIS — Z0000 Encounter for general adult medical examination without abnormal findings: Secondary | ICD-10-CM | POA: Diagnosis not present

## 2017-08-24 DIAGNOSIS — E7800 Pure hypercholesterolemia, unspecified: Secondary | ICD-10-CM | POA: Diagnosis not present

## 2017-11-11 ENCOUNTER — Other Ambulatory Visit: Payer: Self-pay | Admitting: Internal Medicine

## 2017-11-11 DIAGNOSIS — Z1231 Encounter for screening mammogram for malignant neoplasm of breast: Secondary | ICD-10-CM

## 2017-11-24 DIAGNOSIS — E7800 Pure hypercholesterolemia, unspecified: Secondary | ICD-10-CM | POA: Diagnosis not present

## 2017-12-01 DIAGNOSIS — E875 Hyperkalemia: Secondary | ICD-10-CM | POA: Diagnosis not present

## 2017-12-01 DIAGNOSIS — E559 Vitamin D deficiency, unspecified: Secondary | ICD-10-CM | POA: Diagnosis not present

## 2017-12-01 DIAGNOSIS — Z0000 Encounter for general adult medical examination without abnormal findings: Secondary | ICD-10-CM | POA: Diagnosis not present

## 2017-12-01 DIAGNOSIS — Z780 Asymptomatic menopausal state: Secondary | ICD-10-CM | POA: Diagnosis not present

## 2017-12-01 DIAGNOSIS — E785 Hyperlipidemia, unspecified: Secondary | ICD-10-CM | POA: Diagnosis not present

## 2017-12-13 ENCOUNTER — Ambulatory Visit
Admission: RE | Admit: 2017-12-13 | Discharge: 2017-12-13 | Disposition: A | Discharge status: Home / Self Care | Payer: Medicare Other | Source: Ambulatory Visit | Attending: Internal Medicine | Admitting: Internal Medicine

## 2017-12-13 DIAGNOSIS — Z1231 Encounter for screening mammogram for malignant neoplasm of breast: Secondary | ICD-10-CM | POA: Diagnosis not present

## 2018-01-11 DIAGNOSIS — M71341 Other bursal cyst, right hand: Secondary | ICD-10-CM | POA: Diagnosis not present

## 2018-01-11 DIAGNOSIS — L821 Other seborrheic keratosis: Secondary | ICD-10-CM | POA: Diagnosis not present

## 2018-01-11 DIAGNOSIS — D485 Neoplasm of uncertain behavior of skin: Secondary | ICD-10-CM | POA: Diagnosis not present

## 2018-01-11 DIAGNOSIS — Z85828 Personal history of other malignant neoplasm of skin: Secondary | ICD-10-CM | POA: Diagnosis not present

## 2018-01-11 DIAGNOSIS — L820 Inflamed seborrheic keratosis: Secondary | ICD-10-CM | POA: Diagnosis not present

## 2018-01-11 DIAGNOSIS — D2261 Melanocytic nevi of right upper limb, including shoulder: Secondary | ICD-10-CM | POA: Diagnosis not present

## 2018-01-11 DIAGNOSIS — D692 Other nonthrombocytopenic purpura: Secondary | ICD-10-CM | POA: Diagnosis not present

## 2018-01-11 DIAGNOSIS — L814 Other melanin hyperpigmentation: Secondary | ICD-10-CM | POA: Diagnosis not present

## 2018-01-12 DIAGNOSIS — H26491 Other secondary cataract, right eye: Secondary | ICD-10-CM | POA: Diagnosis not present

## 2018-01-12 DIAGNOSIS — H04123 Dry eye syndrome of bilateral lacrimal glands: Secondary | ICD-10-CM | POA: Diagnosis not present

## 2018-01-12 DIAGNOSIS — H524 Presbyopia: Secondary | ICD-10-CM | POA: Diagnosis not present

## 2018-01-20 DIAGNOSIS — H26492 Other secondary cataract, left eye: Secondary | ICD-10-CM | POA: Diagnosis not present

## 2018-02-03 DIAGNOSIS — M25561 Pain in right knee: Secondary | ICD-10-CM | POA: Diagnosis not present

## 2018-02-03 DIAGNOSIS — M1711 Unilateral primary osteoarthritis, right knee: Secondary | ICD-10-CM | POA: Diagnosis not present

## 2018-02-03 DIAGNOSIS — Z96641 Presence of right artificial hip joint: Secondary | ICD-10-CM | POA: Diagnosis not present

## 2018-03-29 DIAGNOSIS — Z6824 Body mass index (BMI) 24.0-24.9, adult: Secondary | ICD-10-CM | POA: Diagnosis not present

## 2018-03-29 DIAGNOSIS — N958 Other specified menopausal and perimenopausal disorders: Secondary | ICD-10-CM | POA: Diagnosis not present

## 2018-03-29 DIAGNOSIS — M8588 Other specified disorders of bone density and structure, other site: Secondary | ICD-10-CM | POA: Diagnosis not present

## 2018-03-29 DIAGNOSIS — Z01419 Encounter for gynecological examination (general) (routine) without abnormal findings: Secondary | ICD-10-CM | POA: Diagnosis not present

## 2018-04-08 DIAGNOSIS — Z23 Encounter for immunization: Secondary | ICD-10-CM | POA: Diagnosis not present

## 2018-07-18 ENCOUNTER — Encounter (INDEPENDENT_AMBULATORY_CARE_PROVIDER_SITE_OTHER): Payer: Medicare Other | Admitting: Ophthalmology

## 2018-07-18 DIAGNOSIS — H353113 Nonexudative age-related macular degeneration, right eye, advanced atrophic without subfoveal involvement: Secondary | ICD-10-CM

## 2018-07-18 DIAGNOSIS — H43813 Vitreous degeneration, bilateral: Secondary | ICD-10-CM | POA: Diagnosis not present

## 2018-07-18 DIAGNOSIS — H33303 Unspecified retinal break, bilateral: Secondary | ICD-10-CM | POA: Diagnosis not present

## 2018-11-09 ENCOUNTER — Other Ambulatory Visit: Payer: Self-pay | Admitting: Internal Medicine

## 2018-11-09 DIAGNOSIS — Z1231 Encounter for screening mammogram for malignant neoplasm of breast: Secondary | ICD-10-CM

## 2018-11-18 ENCOUNTER — Other Ambulatory Visit: Payer: Self-pay

## 2018-11-18 ENCOUNTER — Encounter (INDEPENDENT_AMBULATORY_CARE_PROVIDER_SITE_OTHER): Payer: Medicare Other | Admitting: Ophthalmology

## 2018-11-18 DIAGNOSIS — H353131 Nonexudative age-related macular degeneration, bilateral, early dry stage: Secondary | ICD-10-CM

## 2018-11-18 DIAGNOSIS — H33302 Unspecified retinal break, left eye: Secondary | ICD-10-CM | POA: Diagnosis not present

## 2018-11-18 DIAGNOSIS — H43813 Vitreous degeneration, bilateral: Secondary | ICD-10-CM | POA: Diagnosis not present

## 2018-12-30 ENCOUNTER — Ambulatory Visit: Payer: Medicare Other

## 2019-01-12 DIAGNOSIS — D225 Melanocytic nevi of trunk: Secondary | ICD-10-CM | POA: Diagnosis not present

## 2019-01-12 DIAGNOSIS — Z85828 Personal history of other malignant neoplasm of skin: Secondary | ICD-10-CM | POA: Diagnosis not present

## 2019-01-12 DIAGNOSIS — D2271 Melanocytic nevi of right lower limb, including hip: Secondary | ICD-10-CM | POA: Diagnosis not present

## 2019-01-12 DIAGNOSIS — L821 Other seborrheic keratosis: Secondary | ICD-10-CM | POA: Diagnosis not present

## 2019-01-12 DIAGNOSIS — D692 Other nonthrombocytopenic purpura: Secondary | ICD-10-CM | POA: Diagnosis not present

## 2019-01-12 DIAGNOSIS — L814 Other melanin hyperpigmentation: Secondary | ICD-10-CM | POA: Diagnosis not present

## 2019-01-12 DIAGNOSIS — I788 Other diseases of capillaries: Secondary | ICD-10-CM | POA: Diagnosis not present

## 2019-01-12 DIAGNOSIS — D1801 Hemangioma of skin and subcutaneous tissue: Secondary | ICD-10-CM | POA: Diagnosis not present

## 2019-02-02 DIAGNOSIS — M1711 Unilateral primary osteoarthritis, right knee: Secondary | ICD-10-CM | POA: Diagnosis not present

## 2019-02-02 DIAGNOSIS — M25561 Pain in right knee: Secondary | ICD-10-CM | POA: Diagnosis not present

## 2019-02-20 DIAGNOSIS — H04123 Dry eye syndrome of bilateral lacrimal glands: Secondary | ICD-10-CM | POA: Diagnosis not present

## 2019-02-20 DIAGNOSIS — H353131 Nonexudative age-related macular degeneration, bilateral, early dry stage: Secondary | ICD-10-CM | POA: Diagnosis not present

## 2019-02-20 DIAGNOSIS — H31092 Other chorioretinal scars, left eye: Secondary | ICD-10-CM | POA: Diagnosis not present

## 2019-02-20 DIAGNOSIS — Z961 Presence of intraocular lens: Secondary | ICD-10-CM | POA: Diagnosis not present

## 2019-03-02 ENCOUNTER — Ambulatory Visit
Admission: RE | Admit: 2019-03-02 | Discharge: 2019-03-02 | Disposition: A | Discharge status: Home / Self Care | Payer: Medicare Other | Source: Ambulatory Visit | Attending: Internal Medicine | Admitting: Internal Medicine

## 2019-03-02 ENCOUNTER — Other Ambulatory Visit: Payer: Self-pay

## 2019-03-02 DIAGNOSIS — Z1231 Encounter for screening mammogram for malignant neoplasm of breast: Secondary | ICD-10-CM

## 2019-03-31 DIAGNOSIS — Z23 Encounter for immunization: Secondary | ICD-10-CM | POA: Diagnosis not present

## 2019-05-11 DIAGNOSIS — E559 Vitamin D deficiency, unspecified: Secondary | ICD-10-CM | POA: Diagnosis not present

## 2019-05-11 DIAGNOSIS — E875 Hyperkalemia: Secondary | ICD-10-CM | POA: Diagnosis not present

## 2019-05-11 DIAGNOSIS — M8589 Other specified disorders of bone density and structure, multiple sites: Secondary | ICD-10-CM | POA: Diagnosis not present

## 2019-05-11 DIAGNOSIS — E785 Hyperlipidemia, unspecified: Secondary | ICD-10-CM | POA: Diagnosis not present

## 2019-05-23 DIAGNOSIS — E7800 Pure hypercholesterolemia, unspecified: Secondary | ICD-10-CM | POA: Diagnosis not present

## 2019-05-23 DIAGNOSIS — Z0000 Encounter for general adult medical examination without abnormal findings: Secondary | ICD-10-CM | POA: Diagnosis not present

## 2019-07-10 DIAGNOSIS — Z85828 Personal history of other malignant neoplasm of skin: Secondary | ICD-10-CM | POA: Diagnosis not present

## 2019-07-10 DIAGNOSIS — L820 Inflamed seborrheic keratosis: Secondary | ICD-10-CM | POA: Diagnosis not present

## 2019-07-19 ENCOUNTER — Encounter (INDEPENDENT_AMBULATORY_CARE_PROVIDER_SITE_OTHER): Payer: Medicare Other | Admitting: Ophthalmology

## 2019-11-21 DIAGNOSIS — E7800 Pure hypercholesterolemia, unspecified: Secondary | ICD-10-CM | POA: Diagnosis not present

## 2019-11-23 ENCOUNTER — Other Ambulatory Visit: Payer: Self-pay

## 2019-11-23 ENCOUNTER — Encounter (INDEPENDENT_AMBULATORY_CARE_PROVIDER_SITE_OTHER): Payer: Medicare Other | Admitting: Ophthalmology

## 2019-11-23 DIAGNOSIS — H33302 Unspecified retinal break, left eye: Secondary | ICD-10-CM

## 2019-11-23 DIAGNOSIS — H353131 Nonexudative age-related macular degeneration, bilateral, early dry stage: Secondary | ICD-10-CM | POA: Diagnosis not present

## 2019-11-23 DIAGNOSIS — H43813 Vitreous degeneration, bilateral: Secondary | ICD-10-CM | POA: Diagnosis not present

## 2020-01-12 DIAGNOSIS — L821 Other seborrheic keratosis: Secondary | ICD-10-CM | POA: Diagnosis not present

## 2020-01-12 DIAGNOSIS — D1801 Hemangioma of skin and subcutaneous tissue: Secondary | ICD-10-CM | POA: Diagnosis not present

## 2020-01-12 DIAGNOSIS — D692 Other nonthrombocytopenic purpura: Secondary | ICD-10-CM | POA: Diagnosis not present

## 2020-01-12 DIAGNOSIS — L720 Epidermal cyst: Secondary | ICD-10-CM | POA: Diagnosis not present

## 2020-01-12 DIAGNOSIS — Z85828 Personal history of other malignant neoplasm of skin: Secondary | ICD-10-CM | POA: Diagnosis not present

## 2020-01-12 DIAGNOSIS — L814 Other melanin hyperpigmentation: Secondary | ICD-10-CM | POA: Diagnosis not present

## 2020-02-21 DIAGNOSIS — Z961 Presence of intraocular lens: Secondary | ICD-10-CM | POA: Diagnosis not present

## 2020-02-21 DIAGNOSIS — H31092 Other chorioretinal scars, left eye: Secondary | ICD-10-CM | POA: Diagnosis not present

## 2020-02-21 DIAGNOSIS — H353131 Nonexudative age-related macular degeneration, bilateral, early dry stage: Secondary | ICD-10-CM | POA: Diagnosis not present

## 2020-02-21 DIAGNOSIS — H04123 Dry eye syndrome of bilateral lacrimal glands: Secondary | ICD-10-CM | POA: Diagnosis not present

## 2020-03-22 DIAGNOSIS — Z23 Encounter for immunization: Secondary | ICD-10-CM | POA: Diagnosis not present

## 2020-04-03 ENCOUNTER — Other Ambulatory Visit: Payer: Self-pay | Admitting: Internal Medicine

## 2020-04-03 DIAGNOSIS — Z1231 Encounter for screening mammogram for malignant neoplasm of breast: Secondary | ICD-10-CM

## 2020-04-22 DIAGNOSIS — Z23 Encounter for immunization: Secondary | ICD-10-CM | POA: Diagnosis not present

## 2020-05-01 ENCOUNTER — Ambulatory Visit: Payer: Medicare Other

## 2020-06-18 DIAGNOSIS — L821 Other seborrheic keratosis: Secondary | ICD-10-CM | POA: Diagnosis not present

## 2020-06-18 DIAGNOSIS — Z85828 Personal history of other malignant neoplasm of skin: Secondary | ICD-10-CM | POA: Diagnosis not present

## 2020-06-18 DIAGNOSIS — L820 Inflamed seborrheic keratosis: Secondary | ICD-10-CM | POA: Diagnosis not present

## 2020-11-26 ENCOUNTER — Encounter (INDEPENDENT_AMBULATORY_CARE_PROVIDER_SITE_OTHER): Payer: 59 | Admitting: Ophthalmology

## 2020-11-26 ENCOUNTER — Other Ambulatory Visit: Payer: Self-pay

## 2020-11-26 DIAGNOSIS — H353131 Nonexudative age-related macular degeneration, bilateral, early dry stage: Secondary | ICD-10-CM

## 2020-11-26 DIAGNOSIS — H33302 Unspecified retinal break, left eye: Secondary | ICD-10-CM | POA: Diagnosis not present

## 2020-11-26 DIAGNOSIS — H43813 Vitreous degeneration, bilateral: Secondary | ICD-10-CM | POA: Diagnosis not present

## 2021-11-26 ENCOUNTER — Encounter (INDEPENDENT_AMBULATORY_CARE_PROVIDER_SITE_OTHER): Payer: 59 | Admitting: Ophthalmology

## 2021-11-26 DIAGNOSIS — H33302 Unspecified retinal break, left eye: Secondary | ICD-10-CM | POA: Diagnosis not present

## 2021-11-26 DIAGNOSIS — H353121 Nonexudative age-related macular degeneration, left eye, early dry stage: Secondary | ICD-10-CM

## 2021-11-26 DIAGNOSIS — H43813 Vitreous degeneration, bilateral: Secondary | ICD-10-CM

## 2021-11-26 DIAGNOSIS — H353112 Nonexudative age-related macular degeneration, right eye, intermediate dry stage: Secondary | ICD-10-CM | POA: Diagnosis not present

## 2022-12-01 ENCOUNTER — Encounter (INDEPENDENT_AMBULATORY_CARE_PROVIDER_SITE_OTHER): Payer: 59 | Admitting: Ophthalmology

## 2022-12-01 DIAGNOSIS — H353131 Nonexudative age-related macular degeneration, bilateral, early dry stage: Secondary | ICD-10-CM

## 2022-12-01 DIAGNOSIS — H33302 Unspecified retinal break, left eye: Secondary | ICD-10-CM | POA: Diagnosis not present

## 2022-12-01 DIAGNOSIS — H43813 Vitreous degeneration, bilateral: Secondary | ICD-10-CM

## 2023-12-01 ENCOUNTER — Encounter (INDEPENDENT_AMBULATORY_CARE_PROVIDER_SITE_OTHER): Payer: 59 | Admitting: Ophthalmology

## 2023-12-01 DIAGNOSIS — H33302 Unspecified retinal break, left eye: Secondary | ICD-10-CM

## 2023-12-01 DIAGNOSIS — H43813 Vitreous degeneration, bilateral: Secondary | ICD-10-CM

## 2023-12-01 DIAGNOSIS — H353131 Nonexudative age-related macular degeneration, bilateral, early dry stage: Secondary | ICD-10-CM

## 2024-04-05 ENCOUNTER — Ambulatory Visit: Admitting: Sports Medicine

## 2024-05-12 ENCOUNTER — Ambulatory Visit (INDEPENDENT_AMBULATORY_CARE_PROVIDER_SITE_OTHER): Admitting: Sports Medicine

## 2024-05-12 ENCOUNTER — Encounter: Payer: Self-pay | Admitting: Sports Medicine

## 2024-05-12 VITALS — BP 123/78 | HR 60 | Temp 97.7°F | Ht 64.0 in | Wt 130.8 lb

## 2024-05-12 DIAGNOSIS — E559 Vitamin D deficiency, unspecified: Secondary | ICD-10-CM

## 2024-05-12 DIAGNOSIS — F411 Generalized anxiety disorder: Secondary | ICD-10-CM

## 2024-05-12 DIAGNOSIS — F5101 Primary insomnia: Secondary | ICD-10-CM | POA: Diagnosis not present

## 2024-05-12 DIAGNOSIS — R413 Other amnesia: Secondary | ICD-10-CM

## 2024-05-12 LAB — COMPREHENSIVE METABOLIC PANEL WITH GFR
ALT: 11 U/L (ref 0–35)
AST: 19 U/L (ref 0–37)
Albumin: 3.9 g/dL (ref 3.5–5.2)
Alkaline Phosphatase: 60 U/L (ref 39–117)
BUN: 11 mg/dL (ref 6–23)
CO2: 30 meq/L (ref 19–32)
Calcium: 9.1 mg/dL (ref 8.4–10.5)
Chloride: 103 meq/L (ref 96–112)
Creatinine, Ser: 0.72 mg/dL (ref 0.40–1.20)
GFR: 74.39 mL/min (ref >60.00)
Glucose, Bld: 78 mg/dL (ref 70–99)
Potassium: 4.5 meq/L (ref 3.5–5.1)
Sodium: 138 meq/L (ref 135–145)
Total Bilirubin: 0.4 mg/dL (ref 0.2–1.2)
Total Protein: 6.1 g/dL (ref 6.0–8.3)

## 2024-05-12 LAB — CBC WITH DIFFERENTIAL/PLATELET
Basophils Absolute: 0 K/uL (ref 0.0–0.1)
Basophils Relative: 0.7 % (ref 0.0–3.0)
Eosinophils Absolute: 0.1 K/uL (ref 0.0–0.7)
Eosinophils Relative: 2 % (ref 0.0–5.0)
HCT: 36.9 % (ref 36.0–46.0)
Hemoglobin: 12.2 g/dL (ref 12.0–15.0)
Lymphocytes Relative: 22.1 % (ref 12.0–46.0)
Lymphs Abs: 1 K/uL (ref 0.7–4.0)
MCHC: 32.9 g/dL (ref 30.0–36.0)
MCV: 86.3 fl (ref 78.0–100.0)
Monocytes Absolute: 0.5 K/uL (ref 0.1–1.0)
Monocytes Relative: 11.6 % (ref 3.0–12.0)
Neutro Abs: 2.9 K/uL (ref 1.4–7.7)
Neutrophils Relative %: 63.6 % (ref 43.0–77.0)
Platelets: 239 K/uL (ref 150.0–400.0)
RBC: 4.28 Mil/uL (ref 3.87–5.11)
RDW: 13.5 % (ref 11.5–15.5)
WBC: 4.6 K/uL (ref 4.0–10.5)

## 2024-05-12 LAB — VITAMIN D 25 HYDROXY (VIT D DEFICIENCY, FRACTURES): Vit D, 25-Hydroxy: 70 ng/mL (ref 30–100)

## 2024-05-12 LAB — TSH: TSH: 3.31 u[IU]/mL (ref 0.35–5.50)

## 2024-05-12 LAB — VITAMIN B12: Vitamin B-12: >1500 pg/mL — ABNORMAL HIGH (ref 211–911)

## 2024-05-12 MED ORDER — ESCITALOPRAM OXALATE 10 MG PO TABS: 10.0000 mg | ORAL_TABLET | Freq: Every day | ORAL | 0 refills | Status: DC

## 2024-05-12 NOTE — Patient Instructions (Addendum)
 Take melatonin 5 mg at bedtime for sleep    If labs were collected or images ordered, we will inform you of the results once we have received and reviewed them. We will contact you either by [facility] group, or telephone call. If a referral to a specialist was entered for you, please call us  in 2 weeks if you have not heard from the specialist office to schedule.   Thank you for choosing us  for your care. Wishing you a healthy and peaceful winter season. Stay well and we look forward to seeing you at your next visit.

## 2024-05-12 NOTE — Progress Notes (Signed)
 New Patient Office Visit  Patient ID: Ariana Ray, Female   DOB: 05-20-1933 89 y.o. MRN: [id number] Subjective:     Discussed the use of AI scribe software for clinical note transcription with the patient, who gave verbal consent to proceed.  History of Present Illness  Ariana Ray is an 89 year old female who presents to establish care for memory problems. She is accompanied by her daughter  Over the past year, she has experienced memory problems, including difficulty remembering directions and repeating questions. She got lost while driving and was found on a golf cart path. She forgot the name of her dentist and cannot recall how to get to familiar places without assistance. Despite passing a mini cognitive test, her daughter is concerned about her memory decline. There is a family history of dementia, as her sister has the condition.  Her eating habits have changed; she picks at her food and has been losing weight. She is obsessed with her weight, despite weighing 130 pounds. She eats two meals a day, with a light dinner consisting of juice or wine and chocolate. She attributes her weight loss to her exercise routine, which includes walking four to six miles and cycling two to eight miles daily.  She lives alone and is generally independent, managing her own medications and finances. However, her daughter is concerned about her ability to drive safely and manage her affairs due to her memory issues. She has a history of anxiety, often worrying about various issues such as potential roof leaks and yard maintenance.  She goes to bed between 7:30 and 10:30 PM and wakes up around 5:30 to 6:00 AM. She sometimes has trouble falling asleep and wakes up to use the bathroom but can usually return to sleep. She takes an over-the-counter sleep aid,   No chest pain, shortness of breath, or urinary issues.   Outpatient Encounter Medications as of 05/12/2024   Medication Sig   aspirin  EC 325 MG EC tablet Take 1 tablet (325 mg total) by mouth 2 (two) times daily.   Cholecalciferol (VITAMIN D3) 2000 UNITS TABS Take 1 tablet by mouth every morning.   Multiple Vitamins-Minerals (CENTRUM SILVER ADULT 50+ PO) Take 1 tablet by mouth every morning.   beta carotene w/minerals (OCUVITE) tablet Take 1 tablet by mouth every morning. (Patient not taking: Reported on 05/12/2024)   polyethylene glycol (MIRALAX  / GLYCOLAX ) packet Take 17 g by mouth 2 (two) times daily. (Patient not taking: Reported on 05/12/2024)   pravastatin (PRAVACHOL) 10 MG tablet TAKE 1 TABLET BY MOUTH ONE TIME PER WEEK (Patient not taking: Reported on 05/12/2024)   prednisoLONE acetate (PRED FORTE) 1 % ophthalmic suspension  (Patient not taking: Reported on 05/12/2024)   simvastatin (ZOCOR) 10 MG tablet  (Patient not taking: Reported on 05/12/2024)   [DISCONTINUED] b complex vitamins tablet Take 1 tablet by mouth every morning.   [DISCONTINUED] CALCIUM CITRATE PO Take 1 tablet by mouth 2 (two) times daily.   [DISCONTINUED] diazepam  (VALIUM ) 2 MG tablet Take 1 tablet (2 mg total) by mouth once.   [DISCONTINUED] docusate sodium  100 MG CAPS Take 100 mg by mouth 2 (two) times daily.   [DISCONTINUED] ferrous sulfate  325 (65 FE) MG tablet Take 1 tablet (325 mg total) by mouth 3 (three) times daily after meals.   [DISCONTINUED] Ginger, Zingiber officinalis, (GINGER ROOT) 550 MG CAPS Take 1 capsule by mouth every morning.   [DISCONTINUED] HYDROcodone -acetaminophen  (NORCO/VICODIN) 5-325 MG tablet Take 1 tablet by mouth once.   [  DISCONTINUED] ibuprofen  (ADVIL ,MOTRIN ) 400 MG tablet Take 1 tablet (400 mg total) by mouth 3 (three) times daily.   [DISCONTINUED] Omega-3 Fatty Acids (FISH OIL) 1200 MG CAPS Take 1 capsule by mouth 2 (two) times daily.   [DISCONTINUED] predniSONE  (DELTASONE ) 10 MG tablet Take 6 tablets (60 mg total) by mouth daily.   [DISCONTINUED] temazepam  (RESTORIL ) 15 MG capsule Take 15 mg  by mouth at bedtime as needed for sleep.   [DISCONTINUED] tiZANidine  (ZANAFLEX ) 4 MG tablet Take 1 tablet (4 mg total) by mouth every 6 (six) hours as needed for muscle spasms.   No facility-administered encounter medications on file as of 05/12/2024.    Past Medical History:  Diagnosis Date   Anxiety    Arthritis    Depression    GERD (gastroesophageal reflux disease)    hx of     Past Surgical History:  Procedure Laterality Date   ABDOMINAL HYSTERECTOMY     BREAST EXCISIONAL BIOPSY Right    CESAREAN SECTION     EYE SURGERY     JOINT REPLACEMENT     TOTAL HIP ARTHROPLASTY Right 06/26/2014   Procedure: RIGHT TOTAL HIP ARTHROPLASTY ANTERIOR APPROACH;  Surgeon: Donnice JONETTA Car, MD;  Location: [facility];  Service: Orthopedics;  Laterality: Right;    Family History  Problem Relation Age of Onset   Diabetes Mother    Breast cancer Neg Hx     Social History   Socioeconomic History   Marital status: Widowed    Spouse name: Not on file   Number of children: Not on file   Years of education: Not on file   Highest education level: Not on file  Occupational History   Not on file  Tobacco Use   Smoking status: Never   Smokeless tobacco: Never  Substance and Sexual Activity   Alcohol use: Yes    Alcohol/week: 5.0 standard drinks of alcohol    Types: 5 Glasses of wine per week    Comment: 1/4 cup of red wine daily    Drug use: Never   Sexual activity: Never  Other Topics Concern   Not on file  Social History Narrative   Not on file   Social Drivers of Health   Financial Resource Strain: Not on file  Food Insecurity: Not on file  Transportation Needs: Not on file  Physical Activity: Not on file  Stress: Not on file  Social Connections: Not on file  Intimate Partner Violence: Not on file    Review of Systems  Constitutional:  Negative for chills and fever.  HENT:  Negative for congestion and sore throat.   Respiratory:  Negative for cough, sputum production and  shortness of breath.   Cardiovascular:  Negative for chest pain, palpitations and leg swelling.  Gastrointestinal:  Negative for abdominal pain, heartburn and nausea.  Genitourinary:  Negative for dysuria, frequency and hematuria.  Musculoskeletal:  Negative for falls and myalgias.  Neurological:  Negative for dizziness.  Psychiatric/Behavioral:  Positive for memory loss. The patient is nervous/anxious and has insomnia.      Objective:    There were no vitals taken for this visit.  Physical Exam Constitutional:      Appearance: Normal appearance.  HENT:     Head: Normocephalic and atraumatic.  Cardiovascular:     Rate and Rhythm: Normal rate and regular rhythm.  Pulmonary:     Effort: Pulmonary effort is normal. No respiratory distress.     Breath sounds: Normal breath sounds. No wheezing.  Abdominal:     General: Bowel sounds are normal. There is no distension.     Tenderness: There is no abdominal tenderness. There is no guarding or rebound.     Comments:    Musculoskeletal:        General: No swelling or tenderness.  Neurological:     Mental Status: She is alert. Mental status is at baseline.     Motor: No weakness.     Last CBC Lab Results  Component Value Date   WBC 7.4 06/28/2014   HGB 8.8 (L) 06/28/2014   HCT 27.5 (L) 06/28/2014   MCV 89.0 06/28/2014   MCH 28.5 06/28/2014   RDW 13.8 06/28/2014   PLT 197 06/28/2014   Last metabolic panel Lab Results  Component Value Date   GLUCOSE 95 06/28/2014   NA 139 06/28/2014   K 4.1 06/28/2014   CL 107 06/28/2014   CO2 28 06/28/2014   BUN 17 06/28/2014   CREATININE 0.65 06/28/2014   GFRNONAA 82 (L) 06/28/2014   CALCIUM 8.0 (L) 06/28/2014   ANIONGAP 4 (L) 06/28/2014   Last lipids No results found for: CHOL, HDL, LDLCALC, LDLDIRECT, TRIG, CHOLHDL Last hemoglobin A1c No results found for: HGBA1C Last thyroid  functions No results found for: TSH, T3TOTAL, T4TOTAL, FREET4, THYROIDAB Last  vitamin D  No results found for: 25OHVITD2, 25OHVITD3, VD25OH Last vitamin B12 and Folate No results found for: VITAMINB12, FOLATE     Assessment & Plan:   Assessment & Plan Memory loss Daughter reports cognitive decline Repetition + Family h/o dementia  Will check labs Will order MRI  Follow up in clinic after MRI Orders:   CBC w/Diff   Comp Met (CMET)   TSH   B12   MR Brain W Wo Contrast; Future   escitalopram  (LEXAPRO ) 10 MG tablet; Take 1 tablet (10 mg total) by mouth daily.  GAD (generalized anxiety disorder) Daughter reports that pt gets anxious and worries a lot Will start lexapro  Follow up in clinic in 4 weeks    Primary insomnia Discussed sleep hygiene Avoid benadryl  Take melatonin     Vitamin D  deficiency  Orders:   VITAMIN D  25 Hydroxy (Vit-D Deficiency, Fractures)   No follow-ups on file.   Jackalyn Blazing, MD [facility]

## 2024-05-21 ENCOUNTER — Ambulatory Visit: Payer: Self-pay | Admitting: Sports Medicine

## 2024-06-02 ENCOUNTER — Ambulatory Visit (HOSPITAL_BASED_OUTPATIENT_CLINIC_OR_DEPARTMENT_OTHER)
Admission: RE | Admit: 2024-06-02 | Discharge: 2024-06-02 | Disposition: A | Source: Ambulatory Visit | Attending: Sports Medicine

## 2024-06-02 DIAGNOSIS — R413 Other amnesia: Secondary | ICD-10-CM

## 2024-06-02 MED ORDER — GADOBUTROL 1 MMOL/ML IV SOLN
6.0000 mL | Freq: Once | INTRAVENOUS | Status: AC | PRN
  Administered 2024-06-02: 6 mL via INTRAVENOUS
  Filled 2024-06-02: qty 6

## 2024-06-06 ENCOUNTER — Encounter: Payer: Self-pay | Admitting: Sports Medicine

## 2024-06-07 MED ORDER — ATORVASTATIN CALCIUM 10 MG PO TABS: 10.0000 mg | ORAL_TABLET | Freq: Every day | ORAL | 0 refills | Status: AC

## 2024-06-26 ENCOUNTER — Encounter: Payer: Self-pay | Admitting: Sports Medicine

## 2024-06-26 ENCOUNTER — Ambulatory Visit (INDEPENDENT_AMBULATORY_CARE_PROVIDER_SITE_OTHER): Admitting: Sports Medicine

## 2024-06-26 VITALS — BP 110/62 | HR 66 | Temp 97.5°F | Wt 125.1 lb

## 2024-06-26 DIAGNOSIS — Z780 Asymptomatic menopausal state: Secondary | ICD-10-CM

## 2024-06-26 DIAGNOSIS — R634 Abnormal weight loss: Secondary | ICD-10-CM | POA: Diagnosis not present

## 2024-06-26 DIAGNOSIS — F01B4 Vascular dementia, moderate, with anxiety: Secondary | ICD-10-CM

## 2024-06-26 DIAGNOSIS — R6889 Other general symptoms and signs: Secondary | ICD-10-CM

## 2024-06-26 LAB — POCT INFLUENZA A/B
Influenza A, POC: NEGATIVE
Influenza B, POC: NEGATIVE

## 2024-06-26 LAB — POC COVID19 BINAXNOW: SARS Coronavirus 2 Ag: NEGATIVE

## 2024-06-26 MED ORDER — ASPIRIN 81 MG PO TBEC: 81.0000 mg | DELAYED_RELEASE_TABLET | Freq: Every day | ORAL | Status: AC

## 2024-06-26 MED ORDER — ESCITALOPRAM OXALATE 10 MG PO TABS: 10.0000 mg | ORAL_TABLET | Freq: Every day | ORAL | 0 refills | Status: AC

## 2024-06-26 NOTE — Progress Notes (Signed)
 "  Careteam: Patient Care Team: Sherlynn Madden, MD as PCP - General (Internal Medicine)  Allergies[1]  Chief Complaint  Patient presents with   Follow-up    6 week follow up. She has been having a stuffy nose but declines any other symptoms.    Discussed the use of AI scribe software for clinical note transcription with the patient, who gave verbal consent to proceed.  History of Present Illness    Ariana Ray Ariana Ray is an 89 year old female who presents with weight loss and memory issues.  She has experienced significant weight loss, dropping from 130 pounds to 125 pounds, and previously down to 116 pounds. Her appetite has decreased, and she consumes minimal food, such as Ensure and cinnamon raisin toast.     She lives alone and has a very supportive family.   Family reports that she repeats herself and cognitive is getting worse.   IMPRESSION: 1. No acute intracranial abnormality. 2. Asymmetry of the distal Left vertebral artery flow void suggests chronic poor flow or occlusion. Tiny chronic left cerebellar infarct. CTA head and neck could characterize further. 3. Otherwise Moderately advanced for age cerebral white matter signal changes, most commonly due to chronic small vessel disease. And superimposed multiple chronic microhemorrhages scattered in the brain - but not rising to the level of amyloid angiopathy at this time.  No fever, sore throat, sinus pain, ear pain, nasal discharge, abdominal cramping, loose stools, muscle aches, body aches, pain during urination, and recent falls. Occasionally experiences mild cough and dizziness.   Review of Systems:  Review of Systems  Constitutional:  Positive for chills. Negative for malaise/fatigue.  HENT:  Positive for congestion. Negative for sore throat.   Eyes:  Negative for blurred vision.  Respiratory:  Negative for cough and sputum production.   Cardiovascular:  Negative for chest pain, palpitations  and leg swelling.  Neurological:  Negative for dizziness.   Negative unless indicated in HPI.   Patient Active Problem List   Diagnosis Date Noted   Pain in right knee 02/03/2018   S/P right THA, AA 06/26/2014   Past Medical History:  Diagnosis Date   Anxiety    Arthritis    Depression    GERD (gastroesophageal reflux disease)    hx of    Urine incontinence    Past Surgical History:  Procedure Laterality Date   ABDOMINAL HYSTERECTOMY     BREAST EXCISIONAL BIOPSY Right    CESAREAN SECTION     EYE SURGERY     JOINT REPLACEMENT     TOTAL HIP ARTHROPLASTY Right 06/26/2014   Procedure: RIGHT TOTAL HIP ARTHROPLASTY ANTERIOR APPROACH;  Surgeon: Donnice JONETTA Car, MD;  Location: [facility];  Service: Orthopedics;  Laterality: Right;   Social History[2] Family History  Problem Relation Age of Onset   Hyperlipidemia Mother    Diabetes Mother    Rheum arthritis Daughter    Breast cancer Neg Hx    Allergies[3]  Medications: Patient's Medications  New Prescriptions   ASPIRIN  EC 81 MG TABLET    Take 1 tablet (81 mg total) by mouth daily. Swallow whole.  Previous Medications   ATORVASTATIN  (LIPITOR) 10 MG TABLET    Take 1 tablet (10 mg total) by mouth daily.   BETA CAROTENE W/MINERALS (OCUVITE) TABLET    Take 1 tablet by mouth every morning.   CHOLECALCIFEROL (VITAMIN D3) 2000 UNITS TABS    Take 1 tablet by mouth every morning.   MELATONIN 10 MG TABS  Take by mouth.   MULTIPLE VITAMINS-MINERALS (CENTRUM SILVER ADULT 50+ PO)    Take 1 tablet by mouth every morning.  Modified Medications   Modified Medication Previous Medication   ESCITALOPRAM  (LEXAPRO ) 10 MG TABLET escitalopram  (LEXAPRO ) 10 MG tablet      Take 1 tablet (10 mg total) by mouth daily.    Take 1 tablet (10 mg total) by mouth daily.  Discontinued Medications   ASPIRIN  EC 325 MG EC TABLET    Take 1 tablet (325 mg total) by mouth 2 (two) times daily.   POLYETHYLENE GLYCOL (MIRALAX  / GLYCOLAX ) PACKET    Take 17 g by mouth 2  (two) times daily.   PREDNISOLONE ACETATE (PRED FORTE) 1 % OPHTHALMIC SUSPENSION        Physical Exam: Vitals:   06/26/24 0928  BP: 110/62  Pulse: 66  Temp: (!) 97.5 F (36.4 C)  TempSrc: Oral  SpO2: 98%  Weight: 125 lb 1.9 oz (56.8 kg)   Body mass index is 21.48 kg/m. BP Readings from Last 3 Encounters:  06/26/24 110/62  05/12/24 123/78  11/23/15 135/66   Wt Readings from Last 3 Encounters:  06/26/24 125 lb 1.9 oz (56.8 kg)  05/12/24 130 lb 12.8 oz (59.3 kg)  11/23/15 142 lb (64.4 kg)    Physical Exam Constitutional:      Appearance: Normal appearance.  HENT:     Head: Normocephalic and atraumatic.     Right Ear: Tympanic membrane normal.     Left Ear: Tympanic membrane normal.     Mouth/Throat:     Pharynx: Posterior oropharyngeal erythema present. No oropharyngeal exudate.  Cardiovascular:     Rate and Rhythm: Normal rate and regular rhythm.  Pulmonary:     Effort: Pulmonary effort is normal. No respiratory distress.     Breath sounds: Normal breath sounds. No wheezing.  Abdominal:     General: Bowel sounds are normal. There is no distension.     Tenderness: There is no abdominal tenderness. There is no guarding or rebound.     Comments:    Musculoskeletal:        General: No swelling or tenderness.  Neurological:     Mental Status: She is alert. Mental status is at baseline.     Sensory: No sensory deficit.     Motor: No weakness.     Labs reviewed: Basic Metabolic Panel: Recent Labs    05/12/24 0935  NA 138  K 4.5  CL 103  CO2 30  GLUCOSE 78  BUN 11  CREATININE 0.72  CALCIUM  9.1  TSH 3.31   Liver Function Tests: Recent Labs    05/12/24 0935  AST 19  ALT 11  ALKPHOS 60  BILITOT 0.4  PROT 6.1  ALBUMIN 3.9   No results for input(s): LIPASE, AMYLASE in the last 8760 hours. No results for input(s): AMMONIA in the last 8760 hours. CBC: Recent Labs    05/12/24 0935  WBC 4.6  NEUTROABS 2.9  HGB 12.2  HCT 36.9  MCV 86.3   PLT 239.0   Lipid Panel: No results for input(s): CHOL, HDL, LDLCALC, TRIG, CHOLHDL, LDLDIRECT in the last 8760 hours. TSH: Recent Labs    05/12/24 0935  TSH 3.31   A1C: No results found for: HGBA1C  Assessment & Plan Flu-like symptoms Pt reports congestion  few days ago Denies sinus pain , ear pain  Afebrile Flu/ covid neg Orders:   POC COVID-19 BinaxNow   POCT Influenza A/B  Moderate vascular  dementia with anxiety [facility]) MRI results discussed with the daughter Family reports repetition  Planning to move to [facility] Pt has not started taking lexapro  Resent lexapro  to pharmacy Family wants to know if she can take memory medications Discussed with family about the risks and benefits of memory medications Since she lost 5 pounds since last visit and has poor po intake would not recommend memory medications Orders:   escitalopram  (LEXAPRO ) 10 MG tablet; Take 1 tablet (10 mg total) by mouth daily.   aspirin  EC 81 MG tablet; Take 1 tablet (81 mg total) by mouth daily. Swallow whole.  Loss of weight Pt lost 5 lb TSH , labs wnl Instructed to take boost / ensure Increase protein intake    Postmenopausal estrogen deficiency  Orders:   DG Bone Density; Future   Return in about 3 months (around 09/24/2024).:   Waleed Dettman     [1] No Known Allergies [2]  Social History Tobacco Use   Smoking status: Never   Smokeless tobacco: Never  Substance Use Topics   Alcohol use: Yes    Alcohol/week: 5.0 standard drinks of alcohol    Types: 5 Glasses of wine per week    Comment: 1/4 cup of red wine daily    Drug use: Never  [3] No Known Allergies  "

## 2024-09-26 ENCOUNTER — Ambulatory Visit: Admitting: Sports Medicine

## 2024-11-30 ENCOUNTER — Encounter (INDEPENDENT_AMBULATORY_CARE_PROVIDER_SITE_OTHER): Admitting: Ophthalmology
